# Patient Record
Sex: Female | Born: 1965 | Race: Black or African American | Hispanic: No | Marital: Married | Smoking: Never smoker
Health system: Southern US, Community
[De-identification: ages and names within clinical notes are randomized; demographics above are authoritative.]

## PROBLEM LIST (undated history)

## (undated) DIAGNOSIS — F32A Depression, unspecified: Secondary | ICD-10-CM

## (undated) DIAGNOSIS — R7303 Prediabetes: Secondary | ICD-10-CM

## (undated) DIAGNOSIS — E559 Vitamin D deficiency, unspecified: Secondary | ICD-10-CM

## (undated) DIAGNOSIS — F329 Major depressive disorder, single episode, unspecified: Secondary | ICD-10-CM

## (undated) DIAGNOSIS — F419 Anxiety disorder, unspecified: Secondary | ICD-10-CM

## (undated) DIAGNOSIS — M549 Dorsalgia, unspecified: Secondary | ICD-10-CM

## (undated) HISTORY — DX: Dorsalgia, unspecified: M54.9

## (undated) HISTORY — DX: Prediabetes: R73.03

## (undated) HISTORY — DX: Vitamin D deficiency, unspecified: E55.9

## (undated) HISTORY — DX: Depression, unspecified: F32.A

## (undated) HISTORY — DX: Anxiety disorder, unspecified: F41.9

---
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
## 1999-05-25 ENCOUNTER — Other Ambulatory Visit: Admission: RE | Admit: 1999-05-25 | Discharge: 1999-05-25 | Payer: Self-pay | Admitting: Family Medicine

## 2000-02-16 ENCOUNTER — Ambulatory Visit (HOSPITAL_COMMUNITY): Admission: RE | Admit: 2000-02-16 | Discharge: 2000-02-16 | Payer: Self-pay | Admitting: Family Medicine

## 2000-02-16 ENCOUNTER — Encounter: Payer: Self-pay | Admitting: Family Medicine

## 2002-02-20 ENCOUNTER — Encounter: Payer: Self-pay | Admitting: Obstetrics and Gynecology

## 2002-02-20 ENCOUNTER — Ambulatory Visit (HOSPITAL_COMMUNITY): Admission: RE | Admit: 2002-02-20 | Discharge: 2002-02-20 | Payer: Self-pay | Admitting: Obstetrics and Gynecology

## 2002-03-28 ENCOUNTER — Other Ambulatory Visit: Admission: RE | Admit: 2002-03-28 | Discharge: 2002-03-28 | Payer: Self-pay | Admitting: Obstetrics and Gynecology

## 2002-04-03 ENCOUNTER — Ambulatory Visit (HOSPITAL_COMMUNITY): Admission: RE | Admit: 2002-04-03 | Discharge: 2002-04-03 | Payer: Self-pay | Admitting: Obstetrics and Gynecology

## 2002-04-03 ENCOUNTER — Encounter: Payer: Self-pay | Admitting: Obstetrics and Gynecology

## 2002-07-20 ENCOUNTER — Inpatient Hospital Stay (HOSPITAL_COMMUNITY): Admission: AD | Admit: 2002-07-20 | Discharge: 2002-07-23 | Payer: Self-pay | Admitting: Obstetrics and Gynecology

## 2002-07-24 ENCOUNTER — Encounter: Admission: RE | Admit: 2002-07-24 | Discharge: 2002-08-23 | Payer: Self-pay | Admitting: Obstetrics and Gynecology

## 2002-08-24 ENCOUNTER — Encounter: Admission: RE | Admit: 2002-08-24 | Discharge: 2002-09-23 | Payer: Self-pay | Admitting: Obstetrics and Gynecology

## 2002-10-23 ENCOUNTER — Encounter: Admission: RE | Admit: 2002-10-23 | Discharge: 2002-11-22 | Payer: Self-pay | Admitting: Obstetrics and Gynecology

## 2002-12-23 ENCOUNTER — Encounter: Admission: RE | Admit: 2002-12-23 | Discharge: 2003-01-22 | Payer: Self-pay | Admitting: Obstetrics and Gynecology

## 2003-02-22 ENCOUNTER — Encounter: Admission: RE | Admit: 2003-02-22 | Discharge: 2003-03-24 | Payer: Self-pay | Admitting: Obstetrics and Gynecology

## 2003-03-25 ENCOUNTER — Encounter: Admission: RE | Admit: 2003-03-25 | Discharge: 2003-04-24 | Payer: Self-pay | Admitting: Obstetrics and Gynecology

## 2003-03-31 ENCOUNTER — Other Ambulatory Visit: Admission: RE | Admit: 2003-03-31 | Discharge: 2003-03-31 | Payer: Self-pay | Admitting: Obstetrics and Gynecology

## 2006-06-13 ENCOUNTER — Encounter: Admission: RE | Admit: 2006-06-13 | Discharge: 2006-06-13 | Payer: Self-pay | Admitting: Internal Medicine

## 2007-06-21 ENCOUNTER — Encounter: Admission: RE | Admit: 2007-06-21 | Discharge: 2007-06-21 | Payer: Self-pay | Admitting: Internal Medicine

## 2008-05-09 ENCOUNTER — Encounter: Admission: RE | Admit: 2008-05-09 | Discharge: 2008-05-09 | Payer: Self-pay | Admitting: Internal Medicine

## 2008-06-23 ENCOUNTER — Encounter: Admission: RE | Admit: 2008-06-23 | Discharge: 2008-06-23 | Payer: Self-pay | Admitting: Internal Medicine

## 2010-06-29 ENCOUNTER — Encounter
Admission: RE | Admit: 2010-06-29 | Discharge: 2010-06-29 | Payer: Self-pay | Source: Home / Self Care | Attending: Internal Medicine | Admitting: Internal Medicine

## 2010-07-25 ENCOUNTER — Encounter: Payer: Self-pay | Admitting: Internal Medicine

## 2010-11-19 NOTE — Discharge Summary (Signed)
NAME:  Makayla Matthews, Makayla Matthews                         ACCOUNT NO.:  [id number]   MEDICAL RECORD NO.:  [id number]                   PATIENT TYPE:  INP   LOCATION:  9116                                 FACILITY:  [facility]   PHYSICIAN:  Naima A. Dillard, M.D.              DATE OF BIRTH:  [birth year]   DATE OF ADMISSION:  07/20/2002  DATE OF DISCHARGE:  07/23/2002                                 DISCHARGE SUMMARY   ADMITTING DIAGNOSES:  1. Intrauterine pregnancy at 24 and 3/7 weeks.  2. Breech presentation.   DISCHARGE DIAGNOSES:  1. Intrauterine pregnancy at 76 and 3/7 weeks.  2. Breech presentation.   PROCEDURE:  1. Primary low transverse cesarean section.  2. Spinal anesthesia.   HOSPITAL COURSE:  The patient is a 45 year old gravida 1, para 0 at 45 and  3/7 weeks who was admitted on July 20, 2002 for a scheduled cesarean  section secondary to breech presentation.  The patient's pregnancy had been  essentially uncomplicated.  She was taken to the operating room where a  primary low transverse cesarean section was performed secondary to breech  presentation after the patient declined external version.  The findings were  a viable female by the name of Makayla Matthews.  Apgars were 9 and 9 in a frank breech  presentation.  Weight was 7 pounds 13 ounces.  Apgars were 9 and 9.  Normal  uterus, tubes, and ovaries were noted.  The patient was taken to the  recovery room in good condition.  Infant was taken to the full-term nursery  in good condition.  By postoperative day one patient was doing well.  She  was breast-feeding and was planning Micronor for contraception.  Hemoglobin  was 10.3 down from 11.4.  Physical examination was within normal limits.  The rest of her hospital course was uncomplicated.  On postoperative day  three patient was doing well.  The incision appeared to be well approximated  but there was slight edema noted around the area, nontender.  There was a  small amount of old drainage  noted, but no active drainage.  Decision was  made to maintain the staples until July 26, 2002 at which time they will  be removed in the office.  The patient was deemed to have received the full  benefit of her hospital stay and was discharged home.   DISCHARGE INSTRUCTIONS:  Per [facility] handout.   DISCHARGE MEDICATIONS:  1. Motrin 600 mg p.o. q.6h. p.r.n. pain.  2. Tylox one to two p.o. q.3-4h. p.r.n. pain.   DISCHARGE FOLLOWUP:  Occur on July 26, 2002 at 9:45 a.m. for staple  removal and then at six weeks postoperative for routine postpartum  examination.     Renaldo Reel Emilee Hero, C.N.M.                   Naima A. Normand Sloop, M.D.   VLL/MEDQ  D:  07/23/2002  T:  07/24/2002  Job:  [id number]

## 2010-11-19 NOTE — H&P (Signed)
NAMEDIMITRI, DSOUZA                         ACCOUNT NO.:  [id number]   MEDICAL RECORD NO.:  [id number]                   PATIENT TYPE:  INP   LOCATION:  9116                                 FACILITY:  [facility]   PHYSICIAN:  Osborn Coho, M.D.                DATE OF BIRTH:  19-Jun-1966   DATE OF ADMISSION:  07/20/2002  DATE OF DISCHARGE:                                HISTORY & PHYSICAL   DATE OF BIRTH:  [birth year].   CHIEF COMPLAINT:  Intrauterine pregnancy at term with breech presentation.   HISTORY OF PRESENT ILLNESS:  Ms. Lovins is a 45 year old female, Gravida I,  Para 0 with last menstrual period of October 17, 2001 and estimated date of  delivery of July 24, 2002. Currently at 39 3/7th weeks, consistent with a  first trimester ultrasound. She is being admitted for a primary C-section  secondary to breech presentation. The patient declined external cephalic  version. Her prenatal care has been at [facility] with the [facility] since about 11 weeks of pregnancy. Her  prenatal care has been uncomplicated.   LABORATORY DATA:  Blood type O+. Antibody negative. RPR non-reactive.  Rubella immune. Hepatitis B surface antigen negative. Gonorrhea negative.  Chlamydia negative. PAP was normal. Declined HIV testing. Declined AFP as  well as amniocentesis which was offered secondary to advanced maternal age.  Sickle trait was negative. Hemoglobin of 11.9 and hematocrit of 35.7.  Glucose challenge test was normal with a value of 109.   PAST OBSTETRICAL HISTORY:  Gravida I, Para 0.   PAST GYNECOLOGIC HISTORY:  Twelve X, twenty-eight X, 5-6 days. Denies  history of gonorrhea, Chlamydia, or other STD's or pelvic inflammatory  disease. Positive history of vaginal candidiasis. History of oral  contraceptive use, stopped in March of 2003. Denies history of abnormal PAP  smears.   PAST MEDICAL HISTORY:  Denies.   PAST SURGICAL HISTORY:  Wisdom teeth  extraction.   MEDICATIONS:  Prenatal vitamins.   ALLERGIES:  CODEINE (itching). Also seasonal allergies.   SOCIAL HISTORY:  Denies history of cigarette or alcohol use. No drug use.   FAMILY HISTORY:  Mother with chronic hypertension. Mother, maternal  grandmother, maternal aunt, and first cousin with diabetes mellitus. Mother  with thyroid dysfunction. Father with prostate cancer. First cousin with  cardiovascular accident.   REVIEW OF SYSTEMS:  Normal childhood illnesses. Otherwise, unremarkable.   PHYSICAL EXAMINATION:  VITAL SIGNS: Blood pressure 134/82, pulse 99,  temperature 99, O2 sat 99%. Fetal heart rate 140. Respiratory rate 20.  Weight 123 kg. Height 66 inches.  HEENT: Within normal limits. No thyromegaly.  LUNGS: Clear to auscultation and percussion bilaterally.  CARDIAC: Heart regular rate and rhythm.  ABDOMEN: Gravid. Fundal height approximately 40 cm. Positive FH at 140.  BREAST: From office, no masses, nipple retraction, discoloration or axillary  nodes.  PELVIC: Examination deferred. Bedside ultrasound  today confirms breech  presentation.   ASSESSMENT/PLAN:  Ms. Hayashida is a 45 year old Gravida I, Para 0 at 70 3/7th  weeks, admitted for a primary low transverse C-section secondary to breech  presentation after the patient declined external cephalic eversion. The  risks, benefits, and alternatives of the procedure have been discussed with  the patient and her husband. The patient's husband verbalized understanding  and consent signed and witnessed. Preoperative labs have been reviewed.                                               Osborn Coho, M.D.    AR/MEDQ  D:  07/20/2002  T:  07/20/2002  Job:  [id number]

## 2010-11-19 NOTE — Op Note (Signed)
Makayla Matthews, Makayla Matthews                         ACCOUNT NO.:  [id number]   MEDICAL RECORD NO.:  [id number]                   PATIENT TYPE:  OUT   LOCATION:  ULT                                  FACILITY:  [facility]   PHYSICIAN:  Osborn Coho, M.D.                DATE OF BIRTH:  28-Feb-1966   DATE OF PROCEDURE:  07/20/2002  DATE OF DISCHARGE:                                 OPERATIVE REPORT   PREOPERATIVE DIAGNOSIS:  1. Intrauterine pregnancy at 39-3/7 weeks.  2. Breech presentation.   POSTOPERATIVE DIAGNOSES:  1. Intrauterine pregnancy at 39-3/7 weeks.  2. Breech presentation.   PROCEDURE:  Primary low transverse cesarean section via Pfannenstiel skin  incision.   SURGEON:  Osborn Coho, M.D.   ASSISTANT:  Naima A. Normand Sloop, M.D.   ANESTHESIA:  Spinal.   FLUIDS:  2700 cubic centimeters.   ESTIMATED BLOOD LOSS:  600 cubic centimeters.   URINE OUTPUT:  150 cubic centimeters.   COMPLICATIONS:  None.   FINDINGS:  Live female infant Clifton Custard with Apgars of 9 at one minute and 9 at  five minutes, no nuchal cord.  Frank breech presentation.  Normal bilateral  ovaries and tubes.   DESCRIPTION OF PROCEDURE:  The patient was taken to the operating room after  the risks, benefits and alternatives of the procedure were discussed with  the patient.  The patient verbalized understanding and consent signed and  witnessed.  The patient was given a spinal and placed in the dorsal supine  position with a leftward tilt.  The patient was prepped and draped in the  normal sterile fashion and Foley placed.  A Pfannenstiel skin incision was  made and carried down to the underlying layer of fascia.  The fascia was  excised and extended laterally with the Mayo scissors.  Kocher clamps were  placed on the superior aspect of the fascial incision, and the rectus muscle  excised from the fascia.  The same was done on the inferior aspect of the  fascial incision.  The muscle was separated in the midline,  and the  peritoneum entered bluntly.  Bladder blade was placed for retraction and  bladder flap created with the Metzenbaum scissors.  The bladder blade was  replaced, and the uterine incision made with the knife and extended  bilaterally with the bandage scissors.  The infant was then delivered  without difficulty in the frank breech presentation.  The infant upon  delivery was bulb suctioned in the oropharynx and nasopharynx.  The cord was  clamped and cut, and the patient was given cefoxitin.  The infant was handed  off to the waiting pediatricians.  Cord blood was collected.  The placenta  was delivered via fundal massage.  The uterus was cleared of all clots and  debris.  The uterine incision was repaired with 0 Vicryl in a running locked  fashion.  A second imbricating layer was  performed using 0 Vicryl.  The  abdomen was copiously irrigated.  The ovaries were identified and noted to  be normal bilaterally.  The uterine incision was noted to be hemostatic with  one final stitch placed at the left uterine incision angle.  The peritoneum  was closed with 0 chromic in a running fashion.  The fascia was repaired  with 0 Vicryl in a running fashion.  The subcutaneous tissue was irrigated  and noticed to be hemostatic.  The subcutaneous tissue was reapproximated  with 2-0 plain using interrupteds.  The skin was closed with staples.  Sponge, lap and needle count was correct.  The patient tolerated the  procedure well.  The patient was returned to the recovery room in stable  condition.                                               Osborn Coho, M.D.    AR/MEDQ  D:  07/20/2002  T:  07/21/2002  Job:  [id number]

## 2011-06-16 ENCOUNTER — Other Ambulatory Visit: Payer: Self-pay | Admitting: Internal Medicine

## 2011-06-16 DIAGNOSIS — Z1231 Encounter for screening mammogram for malignant neoplasm of breast: Secondary | ICD-10-CM

## 2011-07-08 ENCOUNTER — Ambulatory Visit
Admission: RE | Admit: 2011-07-08 | Discharge: 2011-07-08 | Disposition: A | Discharge status: Home / Self Care | Payer: BC Managed Care – PPO | Source: Ambulatory Visit | Attending: Internal Medicine | Admitting: Internal Medicine

## 2011-07-08 DIAGNOSIS — Z1231 Encounter for screening mammogram for malignant neoplasm of breast: Secondary | ICD-10-CM

## 2012-06-12 ENCOUNTER — Other Ambulatory Visit: Payer: Self-pay | Admitting: Internal Medicine

## 2012-06-12 DIAGNOSIS — Z1231 Encounter for screening mammogram for malignant neoplasm of breast: Secondary | ICD-10-CM

## 2012-07-16 ENCOUNTER — Ambulatory Visit
Admission: RE | Admit: 2012-07-16 | Discharge: 2012-07-16 | Disposition: A | Discharge status: Home / Self Care | Payer: BC Managed Care – PPO | Source: Ambulatory Visit | Attending: Internal Medicine | Admitting: Internal Medicine

## 2012-07-16 DIAGNOSIS — Z1231 Encounter for screening mammogram for malignant neoplasm of breast: Secondary | ICD-10-CM

## 2013-06-12 ENCOUNTER — Other Ambulatory Visit: Payer: Self-pay

## 2013-06-12 DIAGNOSIS — Z1231 Encounter for screening mammogram for malignant neoplasm of breast: Secondary | ICD-10-CM

## 2013-07-17 ENCOUNTER — Ambulatory Visit
Admission: RE | Admit: 2013-07-17 | Discharge: 2013-07-17 | Disposition: A | Discharge status: Home / Self Care | Payer: BC Managed Care – PPO | Source: Ambulatory Visit

## 2013-07-17 DIAGNOSIS — Z1231 Encounter for screening mammogram for malignant neoplasm of breast: Secondary | ICD-10-CM

## 2014-06-09 ENCOUNTER — Other Ambulatory Visit: Payer: Self-pay

## 2014-06-09 DIAGNOSIS — Z1231 Encounter for screening mammogram for malignant neoplasm of breast: Secondary | ICD-10-CM

## 2014-07-18 ENCOUNTER — Ambulatory Visit: Payer: BC Managed Care – PPO

## 2014-07-28 ENCOUNTER — Other Ambulatory Visit: Payer: Self-pay

## 2014-07-28 ENCOUNTER — Ambulatory Visit
Admission: RE | Admit: 2014-07-28 | Discharge: 2014-07-28 | Disposition: A | Discharge status: Home / Self Care | Payer: BC Managed Care – PPO | Source: Ambulatory Visit

## 2014-07-28 DIAGNOSIS — Z1231 Encounter for screening mammogram for malignant neoplasm of breast: Secondary | ICD-10-CM

## 2014-07-30 ENCOUNTER — Other Ambulatory Visit: Payer: Self-pay | Admitting: Internal Medicine

## 2014-07-30 DIAGNOSIS — R928 Other abnormal and inconclusive findings on diagnostic imaging of breast: Secondary | ICD-10-CM

## 2014-08-01 ENCOUNTER — Other Ambulatory Visit: Payer: Self-pay | Admitting: Nurse Practitioner

## 2014-08-01 DIAGNOSIS — R928 Other abnormal and inconclusive findings on diagnostic imaging of breast: Secondary | ICD-10-CM

## 2014-08-13 ENCOUNTER — Ambulatory Visit
Admission: RE | Admit: 2014-08-13 | Discharge: 2014-08-13 | Disposition: A | Discharge status: Home / Self Care | Payer: BC Managed Care – PPO | Source: Ambulatory Visit | Attending: Nurse Practitioner | Admitting: Nurse Practitioner

## 2014-08-13 DIAGNOSIS — R928 Other abnormal and inconclusive findings on diagnostic imaging of breast: Secondary | ICD-10-CM

## 2015-01-02 ENCOUNTER — Other Ambulatory Visit: Payer: Self-pay

## 2015-01-02 ENCOUNTER — Other Ambulatory Visit: Payer: Self-pay | Admitting: Nurse Practitioner

## 2015-01-02 DIAGNOSIS — R921 Mammographic calcification found on diagnostic imaging of breast: Secondary | ICD-10-CM

## 2015-02-17 ENCOUNTER — Other Ambulatory Visit: Payer: Self-pay | Admitting: Nurse Practitioner

## 2015-02-17 DIAGNOSIS — R921 Mammographic calcification found on diagnostic imaging of breast: Secondary | ICD-10-CM

## 2015-02-26 ENCOUNTER — Ambulatory Visit
Admission: RE | Admit: 2015-02-26 | Discharge: 2015-02-26 | Disposition: A | Discharge status: Home / Self Care | Payer: BC Managed Care – PPO | Source: Ambulatory Visit | Attending: Nurse Practitioner | Admitting: Nurse Practitioner

## 2015-02-26 DIAGNOSIS — R921 Mammographic calcification found on diagnostic imaging of breast: Secondary | ICD-10-CM

## 2015-07-31 ENCOUNTER — Other Ambulatory Visit: Payer: Self-pay | Admitting: Nurse Practitioner

## 2015-07-31 ENCOUNTER — Ambulatory Visit: Payer: BC Managed Care – PPO

## 2015-07-31 DIAGNOSIS — R921 Mammographic calcification found on diagnostic imaging of breast: Secondary | ICD-10-CM

## 2015-08-13 ENCOUNTER — Other Ambulatory Visit: Payer: Self-pay | Admitting: Internal Medicine

## 2015-08-13 DIAGNOSIS — R921 Mammographic calcification found on diagnostic imaging of breast: Secondary | ICD-10-CM

## 2015-08-21 ENCOUNTER — Other Ambulatory Visit: Payer: Self-pay | Admitting: Nurse Practitioner

## 2015-08-21 DIAGNOSIS — R921 Mammographic calcification found on diagnostic imaging of breast: Secondary | ICD-10-CM

## 2015-08-24 ENCOUNTER — Other Ambulatory Visit: Payer: Self-pay | Admitting: Nurse Practitioner

## 2015-08-24 DIAGNOSIS — R921 Mammographic calcification found on diagnostic imaging of breast: Secondary | ICD-10-CM

## 2015-09-01 ENCOUNTER — Ambulatory Visit
Admission: RE | Admit: 2015-09-01 | Discharge: 2015-09-01 | Disposition: A | Discharge status: Home / Self Care | Payer: BC Managed Care – PPO | Source: Ambulatory Visit | Attending: Internal Medicine | Admitting: Internal Medicine

## 2015-09-01 DIAGNOSIS — R921 Mammographic calcification found on diagnostic imaging of breast: Secondary | ICD-10-CM

## 2016-10-26 LAB — HEMOGLOBIN A1C

## 2016-10-26 LAB — BASIC METABOLIC PANEL
BUN: 8 (ref 4–21)
Creatinine: 0.7 (ref 0.5–1.1)
Potassium: 4 (ref 3.4–5.3)
Sodium: 141 (ref 137–147)

## 2017-02-24 LAB — HEPATIC FUNCTION PANEL
ALT: 9 (ref 7–35)
AST: 11 — AB (ref 13–35)
Alkaline Phosphatase: 83 (ref 25–125)
BILIRUBIN, TOTAL: 0.6

## 2017-02-24 LAB — LIPID PANEL
Cholesterol: 152 (ref 0–200)
HDL: 57 (ref 35–70)
LDL Cholesterol: 83
LDl/HDL Ratio: 1.5
Triglycerides: 62 (ref 40–160)

## 2017-02-24 LAB — HEMOGLOBIN A1C: Hemoglobin A1C: 6

## 2017-02-24 LAB — TSH: TSH: 1.42 (ref 0.41–5.90)

## 2017-02-24 LAB — BASIC METABOLIC PANEL
BUN: 12 (ref 4–21)
CREATININE: 0.8 (ref 0.5–1.1)
Glucose: 87
Potassium: 4.2 (ref 3.4–5.3)
SODIUM: 139 (ref 137–147)

## 2017-02-24 LAB — VITAMIN D 25 HYDROXY (VIT D DEFICIENCY, FRACTURES): Vit D, 25-Hydroxy: 41.7

## 2018-02-05 ENCOUNTER — Ambulatory Visit: Payer: BC Managed Care – PPO | Admitting: Family Medicine

## 2018-02-05 ENCOUNTER — Encounter: Payer: Self-pay | Admitting: Family Medicine

## 2018-02-05 VITALS — BP 139/93 | HR 92 | Ht 67.0 in | Wt 275.5 lb

## 2018-02-05 DIAGNOSIS — R030 Elevated blood-pressure reading, without diagnosis of hypertension: Secondary | ICD-10-CM | POA: Insufficient documentation

## 2018-02-05 DIAGNOSIS — E559 Vitamin D deficiency, unspecified: Secondary | ICD-10-CM | POA: Insufficient documentation

## 2018-02-05 DIAGNOSIS — I872 Venous insufficiency (chronic) (peripheral): Secondary | ICD-10-CM | POA: Diagnosis not present

## 2018-02-05 DIAGNOSIS — Z111 Encounter for screening for respiratory tuberculosis: Secondary | ICD-10-CM

## 2018-02-05 DIAGNOSIS — R7303 Prediabetes: Secondary | ICD-10-CM | POA: Insufficient documentation

## 2018-02-05 DIAGNOSIS — J3089 Other allergic rhinitis: Secondary | ICD-10-CM

## 2018-02-05 DIAGNOSIS — Z8349 Family history of other endocrine, nutritional and metabolic diseases: Secondary | ICD-10-CM | POA: Insufficient documentation

## 2018-02-05 NOTE — Patient Instructions (Addendum)
Dr. Gertie Exon or Dr. Marda Stalker Miller's who I recommend for her GYN care and who you can see regarding menopause concerns    Please realize, EXERCISE IS MEDICINE!  -  American Heart Association [facility]) guidelines for exercise : If you are in good health, without any medical conditions, you should engage in 150 minutes of moderate intensity aerobic activity per week.  This means you should be huffing and puffing throughout your workout.   Engaging in regular exercise will improve brain function and memory, as well as improve mood, boost immune system and help with weight management.  As well as the other, more well-known effects of exercise such as decreasing blood sugar levels, decreasing blood pressure,  and decreasing bad cholesterol levels/ increasing good cholesterol levels.     -  The AHA strongly endorses consumption of a diet that contains a variety of foods from all the food categories with an emphasis on fruits and vegetables; fat-free and low-fat dairy products; cereal and grain products; legumes and nuts; and fish, poultry, and/or extra lean meats.    Excessive food intake, especially of foods high in saturated and trans fats, sugar, and salt, should be avoided.    Adequate water intake of roughly 1/2 of your weight in pounds, should equal the ounces of water per day you should drink.  So for instance, if you're 200 pounds, that would be 100 ounces of water per day.         Mediterranean Diet  Why follow it? Research shows  Those who follow the Mediterranean diet have a reduced risk of heart disease   The diet is associated with a reduced incidence of Parkinson's and Alzheimer's diseases  People following the diet may have longer life expectancies and lower rates of chronic diseases   The Dietary Guidelines for Americans recommends the Mediterranean diet as an eating plan to promote health and prevent disease  What Is the Mediterranean Diet?   Healthy eating plan based on  typical foods and recipes of Mediterranean-style cooking  The diet is primarily a plant based diet; these foods should make up a majority of meals   Starches - Plant based foods should make up a majority of meals - They are an important sources of vitamins, minerals, energy, antioxidants, and fiber - Choose whole grains, foods high in fiber and minimally processed items  - Typical grain sources include wheat, oats, barley, corn, brown rice, bulgar, farro, millet, polenta, couscous  - Various types of beans include chickpeas, lentils, fava beans, Cassar beans, white beans   Fruits  Veggies - Large quantities of antioxidant rich fruits & veggies; 6 or more servings  - Vegetables can be eaten raw or lightly drizzled with oil and cooked  - Vegetables common to the traditional Mediterranean Diet include: artichokes, arugula, beets, broccoli, brussel sprouts, cabbage, carrots, celery, collard greens, cucumbers, eggplant, kale, leeks, lemons, lettuce, mushrooms, okra, onions, peas, peppers, potatoes, pumpkin, radishes, rutabaga, shallots, spinach, sweet potatoes, turnips, zucchini - Fruits common to the Mediterranean Diet include: apples, apricots, avocados, cherries, clementines, dates, figs, grapefruits, grapes, melons, nectarines, oranges, peaches, pears, pomegranates, strawberries, tangerines  Fats - Replace butter and margarine with healthy oils, such as olive oil, canola oil, and tahini  - Limit nuts to no more than a handful a day  - Nuts include walnuts, almonds, pecans, pistachios, pine nuts  - Limit or avoid candied, honey roasted or heavily salted nuts - Olives are central to the Mediterranean diet - can  be eaten whole or used in a variety of dishes   Meats Protein - Limiting red meat: no more than a few times a month - When eating red meat: choose lean cuts and keep the portion to the size of deck of cards - Eggs: approx. 0 to 4 times a week  - Fish and lean poultry: at least 2 a week  -  Healthy protein sources include, chicken, [place], lean beef, lamb - Increase intake of seafood such as tuna, salmon, trout, mackerel, shrimp, scallops - Avoid or limit high fat processed meats such as sausage and bacon  Dairy - Include moderate amounts of low fat dairy products  - Focus on healthy dairy such as fat free yogurt, skim milk, low or reduced fat cheese - Limit dairy products higher in fat such as whole or 2% milk, cheese, ice cream  Alcohol - Moderate amounts of red wine is ok  - No more than 5 oz daily for women (all ages) and men older than age 66  - No more than 10 oz of wine daily for men younger than 34  Other - Limit sweets and other desserts  - Use herbs and spices instead of salt to flavor foods  - Herbs and spices common to the traditional Mediterranean Diet include: basil, bay leaves, chives, cloves, cumin, fennel, garlic, lavender, marjoram, mint, oregano, parsley, pepper, rosemary, sage, savory, sumac, tarragon, thyme   Its not just a diet, its a lifestyle:   The Mediterranean diet includes lifestyle factors typical of those in the region   Foods, drinks and meals are best eaten with others and savored  Daily physical activity is important for overall good health  This could be strenuous exercise like running and aerobics  This could also be more leisurely activities such as walking, housework, yard-work, or taking the stairs  Moderation is the key; a balanced and healthy diet accommodates most foods and drinks  Consider portion sizes and frequency of consumption of certain foods   Meal Ideas & Options:   Breakfast:  o Whole wheat toast or whole wheat English muffins with peanut butter & hard boiled egg o Steel cut oats topped with apples & cinnamon and skim milk  o Fresh fruit: banana, strawberries, melon, berries, peaches  o Smoothies: strawberries, bananas, greek yogurt, peanut butter o Low fat greek yogurt with blueberries and granola  o Egg white  omelet with spinach and mushrooms o Breakfast couscous: whole wheat couscous, apricots, skim milk, cranberries   Sandwiches:  o Hummus and grilled vegetables (peppers, zucchini, squash) on whole wheat bread   o Grilled chicken on whole wheat pita with lettuce, tomatoes, cucumbers or tzatziki  o Tuna salad on whole wheat bread: tuna salad made with greek yogurt, olives, red peppers, capers, green onions o Garlic rosemary lamb pita: lamb sauted with garlic, rosemary, salt & pepper; add lettuce, cucumber, greek yogurt to pita - flavor with lemon juice and [place] pepper   Seafood:  o Mediterranean grilled salmon, seasoned with garlic, basil, parsley, lemon juice and [place] pepper o Shrimp, lemon, and spinach whole-grain pasta salad made with low fat greek yogurt  o Seared scallops with lemon orzo  o Seared tuna steaks seasoned salt, pepper, coriander topped with tomato mixture of olives, tomatoes, olive oil, minced garlic, parsley, green onions and cappers   Meats:  o Herbed greek chicken salad with kalamata olives, cucumber, feta  o Red bell peppers stuffed with spinach, bulgur, lean ground beef (or lentils) &  topped with feta   o Kebabs: skewers of chicken, tomatoes, onions, zucchini, squash  o [place] burgers: made with red onions, mint, dill, lemon juice, feta cheese topped with roasted red peppers  Vegetarian o Cucumber salad: cucumbers, artichoke hearts, celery, red onion, feta cheese, tossed in olive oil & lemon juice  o Hummus and whole grain pita points with a greek salad (lettuce, tomato, feta, olives, cucumbers, red onion) o Lentil soup with celery, carrots made with vegetable broth, garlic, salt and pepper  o Tabouli salad: parsley, bulgur, mint, scallions, cucumbers, tomato, radishes, lemon juice, olive oil, salt and pepper.     Behavior Modification Ideas for Weight Management  Weight management involves adopting a healthy lifestyle that includes a knowledge of nutrition and  exercise, a positive attitude and the right kind of motivation. Internal motives such as better health, increased energy, self-esteem and personal control increase your chances of lifelong weight management success.  Remember to have realistic goals and think long-term success. Believe in yourself and you can do it. The following information will give you ideas to help you meet your goals.  Control Your Home Environment  Eat only while sitting down at the kitchen or dining room table. Do not eat while watching television, reading, cooking, talking on the phone, standing at the refrigerator or working on the computer. Keep tempting foods out of the house -- don't buy them. Keep tempting foods out of sight. Have low-calorie foods ready to eat. Unless you are preparing a meal, stay out of the kitchen. Have healthy snacks at your disposal, such as small pieces of fruit, vegetables, canned fruit, pretzels, low-fat string cheese and nonfat cottage cheese.  Control Your Work Environment  Do not eat at [employer] or keep tempting snacks at your desk. If you get hungry between meals, plan healthy snacks and bring them with you to work. During your breaks, go for a walk instead of eating. If you work around food, plan in advance the one item you will eat at mealtime. Make it inconvenient to nibble on food by chewing gum, sugarless candy or drinking water or another low-calorie beverage. Do not work through meals. Skipping meals slows down metabolism and may result in overeating at the next meal. If food is available for special occasions, either pick the healthiest item, nibble on low-fat snacks brought from home, don't have anything offered, choose one option and have a small amount, or have only a beverage.  Control Your Mealtime Environment  Serve your plate of food at the stove or kitchen counter. Do not put the serving dishes on the table. If you do put dishes on the table, remove them immediately  when finished eating. Fill half of your plate with vegetables, a quarter with lean protein and a quarter with starch. Use smaller plates, bowls and glasses. A smaller portion will look large when it is in a little dish. Politely refuse second helpings. When fixing your plate, limit portions of food to one scoop/serving or less.   Daily Food Management  Replace eating with another activity that you will not associate with food. Wait 20 minutes before eating something you are craving. Drink a large glass of water or diet soda before eating. Always have a big glass or bottle of water to drink throughout the day. Avoid high-calorie add-ons such as cream with your coffee, butter, mayonnaise and salad dressings.  Shopping: Do not shop when hungry or tired. Shop from a list and avoid buying anything that  is not on your list. If you must have tempting foods, buy individual-sized packages and try to find a lower-calorie alternative. Don't taste test in the store. Read food labels. Compare products to help you make the healthiest choices.  Preparation: Chew a piece of gum while cooking meals. Use a quarter teaspoon if you taste test your food. Try to only fix what you are going to eat, leaving yourself no chance for seconds. If you have prepared more food than you need, portion it into individual containers and freeze or refrigerate immediately. Don't snack while cooking meals.  Eating: Eat slowly. Remember it takes about 20 minutes for your stomach to send a message to your brain that it is full. Don't let fake hunger make you think you need more. The ideal way to eat is to take a bite, put your utensil down, take a sip of water, cut your next bite, take a bit, put your utensil down and so on. Do not cut your food all at one time. Cut only as needed. Take small bites and chew your food well. Stop eating for a minute or two at least once during a meal or snack. Take breaks to reflect and have  conversation.  Cleanup and Leftovers: Label leftovers for a specific meal or snack. Freeze or refrigerate individual portions of leftovers. Do not clean up if you are still hungry.  Eating Out and Social Eating  Do not arrive hungry. Eat something light before the meal. Try to fill up on low-calorie foods, such as vegetables and fruit, and eat smaller portions of the high-calorie foods. Eat foods that you like, but choose small portions. If you want seconds, wait at least 20 minutes after you have eaten to see if you are actually hungry or if your eyes are bigger than your stomach. Limit alcoholic beverages. Try a soda water with a twist of lime. Do not skip other meals in the day to save room for the special event.  At Restaurants: Order  la carte rather than buffet style. Order some vegetables or a salad for an appetizer instead of eating bread. If you order a high-calorie dish, share it with someone. Try an after-dinner mint with your coffee. If you do have dessert, share it with two or more people. Don't overeat because you do not want to waste food. Ask for a doggie bag to take extra food home. Tell the server to put half of your entree in a to go bag before the meal is served to you. Ask for salad dressing, gravy or high-fat sauces on the side. Dip the tip of your fork in the dressing before each bite. If bread is served, ask for only one piece. Try it plain without butter or oil. At [employer] restaurants where oil and vinegar is served with bread, use only a small amount of oil and a lot of vinegar for dipping.  At a Friend's House: Offer to bring a dish, appetizer or dessert that is low in calories. Serve yourself small portions or tell the host that you only want a small amount. Stand or sit away from the snack table. Stay away from the kitchen or stay busy if you are near the food. Limit your alcohol intake.  At [employer]: Cover most of your plate with lettuce  and/or vegetables. Use a salad plate instead of a dinner plate. After eating, clear away your dishes before having coffee or tea.  Entertaining at Home: Explore low-fat, low-cholesterol cookbooks.  Use single-serving foods like chicken breasts or hamburger patties. Prepare low-calorie appetizers and desserts.   Holidays: Keep tempting foods out of sight. Decorate the house without using food. Have low-calorie beverages and foods on hand for guests. Allow yourself one planned treat a day. Don't skip meals to save up for the holiday feast. Eat regular, planned meals.   Exercise Well  Make exercise a priority and a planned activity in the day. If possible, walk the entire or part of the distance to work. Get an exercise buddy. Go for a walk with a colleague during one of your breaks, go to the gym, run or take a walk with a friend, walk in the mall with a shopping companion. Park at the end of the parking lot and walk to the store or office entrance. Always take the stairs all of the way or at least part of the way to your floor. If you have a desk job, walk around the office frequently. Do leg lifts while sitting at your desk. Do something outside on the weekends like going for a hike or a bike ride.   Have a Healthy Attitude  Make health your weight management priority. Be realistic. Have a goal to achieve a healthier you, not necessarily the lowest weight or ideal weight based on calculations or tables. Focus on a healthy eating style, not on dieting. Dieting usually lasts for a short amount of time and rarely produces long-term success. Think long term. You are developing new healthy behaviors to follow next month, in a year and in a decade.    This information is for educational purposes only and is not intended to replace the advice of your doctor or health care provider. We encourage you to discuss with your doctor any questions or concerns you may  have.        Guidelines for Losing Weight   We want weight loss that will last so you should lose 1-2 pounds a week.  THAT IS IT! Please pick THREE things a month to change. Once it is a habit check off the item. Then pick another three items off the list to become habits.  If you are already doing a habit on the list GREAT!  Cross that item off!  Dont drink your calories. Ie, alcohol, soda, fruit juice, and sweet tea.   Drink more water. Drink a glass when you feel hungry or before each meal.   Eat breakfast - Complex carb and protein (likeDannon light and fit yogurt, oatmeal, fruit, eggs, [place] bacon).  Measure your cereal.  Eat no more than one cup a day. (ie Kashi)  Eat an apple a day.  Add a vegetable a day.  Try a new vegetable a month.  Use Pam! Stop using oil or butter to cook.  Dont finish your plate or use smaller plates.  Share your dessert.  Eat sugar free Jello for dessert or frozen grapes.  Dont eat 2-3 hours before bed.  Switch to whole wheat bread, pasta, and brown rice.  Make healthier choices when you eat out. No fries!  Pick baked chicken, NOT fried.  Dont forget to SLOW DOWN when you eat. It is not going anywhere.   Take the stairs.  Park far away in the parking lot  Lift soup cans (or weights) for 10 minutes while watching TV.  Walk at work for 10 minutes during break.  Walk outside 1 time a week with your friend, kids, dog, or significant other.  Start a walking group at church.  Walk the mall as much as you can tolerate.   Keep a food diary.  Weigh yourself daily.  Walk for 15 minutes 3 days per week.  Cook at home more often and eat out less. If life happens and you go back to old habits, it is okay.  Just start over. You can do it!  If you experience chest pain, get short of breath, or tired during the exercise, please stop immediately and inform your doctor.    Before you even begin to attack a weight-loss plan, it  pays to remember this: You are not fat. You have fat. Losing weight isn't about blame or shame; it's simply another achievement to accomplish. Dieting is like any other skill--you have to buckle down and work at it. As long as you act in a smart, reasonable way, you'll ultimately get where you want to be. Here are some weight loss pearls for you.   1. It's Not a Diet. It's a Lifestyle Thinking of a diet as something you're on and suffering through only for the short term doesn't work. To shed weight and keep it off, you need to make permanent changes to the way you eat. It's OK to indulge occasionally, of course, but if you cut calories temporarily and then revert to your old way of eating, you'll gain back the weight quicker than you can say yo-yo. Use it to lose it. Research shows that one of the best predictors of long-term weight loss is how many pounds you drop in the first month. For that reason, nutritionists often suggest being stricter for the first two weeks of your new eating strategy to build momentum. Cut out added sugar and alcohol and avoid unrefined carbs. After that, figure out how you can reincorporate them in a way that's healthy and maintainable.  2. There's a Right Way to Exercise Working out burns calories and fat and boosts your metabolism by building muscle. But those trying to lose weight are notorious for overestimating the number of calories they burn and underestimating the amount they take in. Unfortunately, your system is biologically programmed to hold on to extra pounds and that means when you start exercising, your body senses the deficit and ramps up its hunger signals. If you're not diligent, you'll eat everything you burn and then some. Use it, to lose it. Cardio gets all the exercise glory, but strength and interval training are the real heroes. They help you build lean muscle, which in turn increases your metabolism and calorie-burning ability 3. Don't Overreact to Mild  Hunger Some people have a hard time losing weight because of hunger anxiety. To them, being hungry is bad--something to be avoided at all costs--so they carry snacks with them and eat when they don't need to. Others eat because they're stressed out or bored. While you never want to get to the point of being ravenous (that's when bingeing is likely to happen), a hunger pang, a craving, or the fact that it's 3:00 p.m. should not send you racing for the vending machine or obsessing about the energy bar in your purse. Ideally, you should put off eating until your stomach is growling and it's difficult to concentrate.  Use it to lose it. When you feel the urge to eat, use the HALT method. Ask yourself, Am I really hungry? Or am I angry or anxious, lonely or bored, or tired? If you're still not certain, try the apple test. If  you're truly hungry, an apple should seem delicious; if it doesn't, something else is going on. Or you can try drinking water and making yourself busy, if you are still hungry try a healthy snack.  4. Not All Calories Are Created Equal The mechanics of weight loss are pretty simple: Take in fewer calories than you use for energy. But the kind of food you eat makes all the difference. Processed food that's high in saturated fat and refined starch or sugar can cause inflammation that disrupts the hormone signals that tell your brain you're full. The result: You eat a lot more.  Use it to lose it. Clean up your diet. Swap in whole, unprocessed foods, including vegetables, lean protein, and healthy fats that will fill you up and give you the biggest nutritional bang for your calorie buck. In a few weeks, as your brain starts receiving regular hunger and fullness signals once again, you'll notice that you feel less hungry overall and naturally start cutting back on the amount you eat.  5. Protein, Produce, and Plant-Based Fats Are Your Weight-Loss Trinity Here's why eating the three Ps regularly  will help you drop pounds. Protein fills you up. You need it to build lean muscle, which keeps your metabolism humming so that you can torch more fat. People in a weight-loss program who ate double the recommended daily allowance for protein (about 110 grams for a 150-pound woman) lost 70 percent of their weight from fat, while people who ate the RDA lost only about 40 percent, one study found. Produce is packed with filling fiber. "It's very difficult to consume too many calories if you're eating a lot of vegetables. Example: Three cups of broccoli is a lot of food, yet only 93 calories. (Fruit is another story. It can be easy to overeat and can contain a lot of calories from sugar, so be sure to monitor your intake.) Plant-based fats like olive oil and those in avocados and nuts are healthy and extra satiating.  Use it to lose it. Aim to incorporate each of the three Ps into every meal and snack. People who eat protein throughout the day are able to keep weight off, according to a study in the American Journal of Clinical Nutrition. In addition to meat, poultry and seafood, good sources are beans, lentils, eggs, tofu, and yogurt. As for fat, keep portion sizes in check by measuring out salad dressing, oil, and nut butters (shoot for one to two tablespoons). Finally, eat veggies or a little fruit at every meal. People who did that consumed 308 fewer calories but didn't feel any hungrier than when they didn't eat more produce.  7. How You Eat Is As Important As What You Eat In order for your brain to register that you're full, you need to focus on what you're eating. Sit down whenever you eat, preferably at a table. Turn off the TV or computer, put down your phone, and look at your food. Smell it. Chew slowly, and don't put another bite on your fork until you swallow. When women ate lunch this attentively, they consumed 30 percent less when snacking later than those who listened to an audiobook at lunchtime,  according to a study in the [place] Journal of Nutrition. 8. Weighing Yourself Really Works The scale provides the best evidence about whether your efforts are paying off. Seeing the numbers tick up or down or stagnate is motivation to keep going--or to rethink your approach. A 2015 study at [facility]  found that daily weigh-ins helped people lose more weight, keep it off, and maintain that loss, even after two years. Use it to lose it. Step on the scale at the same time every day for the best results. If your weight shoots up several pounds from one weigh-in to the next, don't freak out. Eating a lot of salt the night before or having your period is the likely culprit. The number should return to normal in a day or two. It's a steady climb that you need to do something about. 9. Too Much Stress and Too Little Sleep Are Your Enemies When you're tired and frazzled, your body cranks up the production of cortisol, the stress hormone that can cause carb cravings. Not getting enough sleep also boosts your levels of ghrelin, a hormone associated with hunger, while suppressing leptin, a hormone that signals fullness and satiety. People on a diet who slept only five and a half hours a night for two weeks lost 55 percent less fat and were hungrier than those who slept eight and a half hours, according to a study in the [place] [employer] Journal. Use it to lose it. Prioritize sleep, aiming for seven hours or more a night, which research shows helps lower stress. And make sure you're getting quality zzz's. If a snoring spouse or a fidgety cat wakes you up frequently throughout the night, you may end up getting the equivalent of just four hours of sleep, according to a study from [facility]. Keep pets out of the bedroom, and use a white-noise app to drown out snoring. 10. You Will Hit a plateau--And You Can Bust Through It As you slim down, your body releases much less leptin, the fullness  hormone.  If you're not strength training, start right now. Building muscle can raise your metabolism to help you overcome a plateau. To keep your body challenged and burning calories, incorporate new moves and more intense intervals into your workouts or add another sweat session to your weekly routine. Alternatively, cut an extra 100 calories or so a day from your diet. Now that you've lost weight, your body simply doesn't need as much fuel.    Since food equals calories, in order to lose weight you must either eat fewer calories, exercise more to burn off calories with activity, or both. Food that is not used to fuel the body is stored as fat. A major component of losing weight is to make smarter food choices. Here's how:  1)   Limit non-nutritious foods, such as: Sugar, honey, syrups and candy Pastries, donuts, pies, cakes and cookies Soft drinks, sweetened juices and alcoholic beverages  2)  Cut down on high-fat foods by: - Choosing poultry, fish or lean red meat - Choosing low-fat cooking methods, such as baking, broiling, steaming, grilling and boiling - Using low-fat or non-fat dairy products - Using vinaigrette, herbs, lemon or fat-free salad dressings - Avoiding fatty meats, such as bacon, sausage, franks, ribs and luncheon meats - Avoiding high-fat snacks like nuts, chips and chocolate - Avoiding fried foods - Using less butter, margarine, oil and mayonnaise - Avoiding high-fat gravies, cream sauces and cream-based soups  3) Eat a variety of foods, including: - Fruit and vegetables that are raw, steamed or baked - Whole grains, breads, cereal, rice and pasta - Dairy products, such as low-fat or non-fat milk or yogurt, low-fat cottage cheese and low-fat cheese - Protein-rich foods like chicken, [place], fish, lean meat and legumes, or beans  4) Change your eating habits by: - Eat three balanced meals a day to help control your hunger - Watch portion sizes and eat small  servings of a variety of foods - Choose low-calorie snacks - Eat only when you are hungry and stop when you are satisfied - Eat slowly and try not to perform other tasks while eating - Find other activities to distract you from food, such as walking, taking up a hobby or being involved in the community - Include regular exercise in your daily routine ( minimum of 20 min of moderate-intensity exercise at least 5 days/week)  - Find a support group, if necessary, for emotional support in your weight loss journey           Easy ways to cut 100 calories   1. Eat your eggs with hot sauce OR salsa instead of cheese.  Eggs are great for breakfast, but many people consider eggs and cheese to be BFFs. Instead of cheese--1 oz. of cheddar has 114 calories--top your eggs with hot sauce, which contains no calories and helps with satiety and metabolism. Salsa is also a great option!!  2. Top your toast, waffles or pancakes with fresh berries instead of jelly or syrup. Half a cup of berries--fresh, frozen or thawed--has about 40 calories, compared with 2 tbsp. of maple syrup or jelly, which both have about 100 calories. The berries will also give you a good punch of fiber, which helps keep you full and satisfied and wont spike blood sugar quickly like the jelly or syrup. 3. Swap the non-fat latte for [place] coffee with a splash of half-and-half. Contrary to its name, that non-fat latte has 130 calories and a startling 19g of carbohydrates per 16 oz. serving. Replacing that light drinkable dessert with a [place] coffee with a splash of half-and-half saves you more than 100 calories per 16 oz. serving. 4. Sprinkle salads with freeze-dried raspberries instead of dried cranberries. If you want a sweet addition to your nutritious salad, stay away from dried cranberries. They have a whopping 130 calories per  cup and 30g carbohydrates. Instead, sprinkle freeze-dried raspberries guilt-free and save more than 100  calories per  cup serving, adding 3g of belly-filling fiber. 5. Go for mustard in place of mayo on your sandwich. Mustard can add really nice flavor to any sandwich, and there are tons of varieties, from spicy to honey. A serving of mayo is 95 calories, versus 10 calories in a serving of mustard.  Or try an avocado mayo spread: You can find the recipe few click this link: https://www.californiaavocado.com/recipes/recipe-container/california-avocado-mayo 6. Choose a DIY salad dressing instead of the store-bought kind. Mix Dijon or whole grain mustard with low-fat Kefir or red wine vinegar and garlic. 7. Use hummus as a spread instead of a dip. Use hummus as a spread on a high-fiber cracker or tortilla with a sandwich and save on calories without sacrificing taste. 8. Pick just one salad accessory. Salad isnt automatically a calorie winner. Its easy to over-accessorize with toppings. Instead of topping your salad with nuts, avocado and cranberries (all three will clock in at 313 calories), just pick one. The next day, choose a different accessory, which will also keep your salad interesting. You dont wear all your jewelry every day, right? 9. Ditch the white pasta in favor of spaghetti squash. One cup of cooked spaghetti squash has about 40 calories, compared with traditional spaghetti, which comes with more than 200. Spaghetti squash is also nutrient-dense. Its a good  source of fiber and Vitamins A and C, and it can be eaten just like you would eat pasta--with a great tomato sauce and [place] meatballs or with pesto, tofu and spinach, for example. 10. Dress up your chili, soups and stews with non-fat [place] yogurt instead of sour cream. Just a dollop of sour cream can set you back 115 calories and a whopping 12g of fat--seven of which are of the artery-clogging variety. Added bonus: [place] yogurt is packed with muscle-building protein, calcium and B Vitamins. 11. Mash cauliflower instead of  mashed potatoes. One cup of traditional mashed potatoes--in all their creamy goodness--has more than 200 calories, compared to mashed cauliflower, which you can typically eat for less than 100 calories per 1 cup serving. Cauliflower is a great source of the antioxidant indole-3-carbinol (I3C), which may help reduce the risk of some cancers, like breast cancer. 12. Ditch the ice cream sundae in favor of a [place] yogurt parfait. Instead of a cup of ice cream or fro-yo for dessert, try 1 cup of nonfat Greek yogurt topped with fresh berries and a sprinkle of cacao nibs. Both toppings are packed with antioxidants, which can help reduce cellular inflammation and oxidative damage. And the comparison is a no-brainer: One cup of ice cream has about 275 calories; one cup of frozen yogurt has about 230; and a cup of Greek yogurt has just 130, plus twice the protein, so youre less likely to return to the freezer for a second helping. 13. Put olive oil in a spray container instead of using it directly from the bottle. Each tablespoon of olive oil is 120 calories and 15g of fat. Use a mister instead of pouring it straight into the pan or onto a salad. This allows for portion control and will save you more than 100 calories. 14. When baking, substitute canned pumpkin for butter or oil. Canned pumpkin--not pumpkin pie mix--is loaded with Vitamin A, which is important for skin and eye health, as well as immunity. And the comparisons are pretty crazy:  cup of canned pumpkin has about 40 calories, compared to butter or oil, which has more than 800 calories. Yes, 800 calories. Applesauce and mashed banana can also serve as good substitutions for butter or oil, usually in a 1:1 ratio. 15. Top casseroles with high-fiber cereal instead of breadcrumbs. Breadcrumbs are typically made with white bread, while breakfast cereals contain 5-9g of fiber per serving. Not only will you save more than 150 calories per  cup serving, the  swap will also keep you more full and youll get a metabolism boost from the added fiber. 16. Snack on pistachios instead of macadamia nuts. Believe it or not, you get the same amount of calories from 35 pistachios (100 calories) as you would from only five macadamia nuts. 17. Chow down on kale chips rather than potato chips. This is my favorite dont knock it till you try it swap. Kale chips are so easy to make at home, and you can spice them up with a little grated parmesan or chili powder. Plus, theyre a mere fraction of the calories of potato chips, but with the same crunch factor we crave so often. 18. Add seltzer and some fruit slices to your cocktail instead of soda or fruit juice. One cup of soda or fruit juice can pack on as much as 140 calories. Instead, use seltzer and fruit slices. The fruit provides valuable phytochemicals, such as flavonoids and anthocyanins, which help to combat cancer and stave off  the aging process.

## 2018-02-05 NOTE — Progress Notes (Signed)
New patient office visit note:  Impression and Recommendations:    1. Elevated blood pressure reading   2. Venous insufficiency (chronic) (peripheral)- R >etr L LExt   3. Prediabetes   4. Family history of thyroid disease   5. Environmental and seasonal allergies   6. Vitamin D deficiency   7. Encounter for PPD test     Gynocology -Explained importance of gynecological doctor for addressing menopausal symptoms -Recommended a referral for GYN for her gynecological needs -Discussed importance of regular mammograms and Pap smears due to age and continuing menstrual cycles  HTN -Discussed with patient current BP goals and notified she needs to lower her blood pressure -Requested ambulatory BP log with next appointment in 4-6 weeks -Stressed the importance of stress, diet and exercise in managing HTN   GI -Referred patient for colonoscopy due to age  Prediabetes -Requested patient get A1C tested and return within 4-6 weeks to discuss results -Stressed the importance of diet and exercise in managing prediabetes and preventing DM  Cholesterol -Requested patient get lipid panel and return within 4-6 weeks to discuss results -Stressed the importance of stress, diet and exercise in managing and preventing HLD  Lifestyle -Discussed importance of watching food portions as well as what we are eating  BMI -Explained to patient what BMI refers to, and what it means medically.    Told patient to think about it as a "medical risk stratification measurement" and how increasing BMI is associated with increasing risk/ or worsening state of various diseases such as hypertension, hyperlipidemia, diabetes, premature OA, depression etc.  -American Heart Association guidelines for healthy diet, basically Mediterranean diet, and exercise guidelines of 30 minutes 5 days per week or more discussed in detail.  -Health counseling performed.  All questions answered.  EXCERCISE -Please  realize, EXERCISE IS MEDICINE! -  American Heart Association [facility]( AHA) guidelines for exercise : you should engage in 150 minutes of moderate intensity aerobic activity per week.  - Engaging in regular exercise will improve brain function and memory, as well as improve mood, boost immune system and help with weight management.   - Other beneficial effects of exercise: decreasing blood sugar levels, decreasing blood pressure,  and decreasing bad cholesterol levels/ increasing good cholesterol levels.   -  The AHA strongly endorses consumption of a diet that contains a variety of foods from all the food categories with an emphasis on fruits and vegetables; fat-free and low-fat dairy products; cereal and grain products; legumes and nuts; and fish, poultry, and/or extra lean meats.     - Excessive food intake, especially of foods high in saturated and trans fats, sugar, and salt, should be avoided.     - Adequate water intake of roughly 1/2 of your weight in pounds, should equal the ounces of water per day you should drink.  So for instance, if you're 200 pounds, that would be 100 ounces of water per day     Education and routine counseling performed. Handouts provided.  Orders Placed This Encounter  Procedures  . TB Skin Test    No orders of the defined types were placed in this encounter.   Gross side effects, risk and benefits, and alternatives of medications discussed with patient.  Patient is aware that all medications have potential side effects and we are unable to predict every side effect or drug-drug interaction that may occur.  Expresses verbal understanding and consents to current therapy plan and treatment regimen.  Return  for Chronic OV w me near future & FBW 2-3d prior.  Please see AVS handed out to patient at the end of our visit for further patient instructions/ counseling done pertaining to today's office visit.    Note: This document was prepared using Dragon voice recognition  software and may include unintentional dictation errors.     This document serves as a record of services personally performed by Thomasene Lot, MD. It was created on her behalf by Alphonse Guild, a trained medical scribe. The creation of this record is based on the scribe's personal observations and the provider's statements to them.   I have reviewed the above medical documentation for accuracy and completeness and I concur.  Thomasene Lot 03/25/18 4:52 PM    ----------------------------------------------------------------------------------------------------------------------    Subjective:    Chief complaint:   Chief Complaint  Patient presents with  . Establish Care     HPI: Makayla Matthews is a pleasant 52 y.o. female who presents to [facility] today to review their medical history with me and establish care.   I asked the patient to review their chronic problem list with me to ensure everything was updated and accurate.    All recent office visits with other providers, any medical records that patient brought in etc  - I reviewed today.     We asked pt to get [place] their medical records from [facility] providers/ specialists that they had seen within the past 3-5 years- if they are in private practice and/or do not work for [employer], [facility], [facility], [facility] or [employer] owned practice.  Told them to call their specialists to clarify this if they are not sure.   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 -Walks 2-3 miles for 30-45 min a week. Started walking in April or May -States she just wanted to start with exercise instead of weight. 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 DM Dx in her 34's and her  current A1C is 5.9 -States her son gets his height from her family -She is the primary caretaker of her mother who has mid-stage alzheimers. Mother was diagnosed in her 67s -Mother had thyroid abnormality; is currently on thyroid medication  -Previous PCP was Dr. Serita Sheller; left because she felt like a number and did not have a good relationship with the doctor -Patient states she was 315 at her heaviest in college and lost weight to 185 -Decided to start exercising more but has gained about 50lbs since she turned 50 despite working out regularly  Social History -States she does drink; roughly one drink a week  Medical History -Patient has experienced swelling in right LE for "3.4 years -Noticed a vein in her leg swelling noticeable and then her foot began to swell -States she is currently taking Furosemide for swelling, which increased her urination -Denies unilateral knee, hip or leg pain aside from "sitting too Matthews in children's chairs at work" -States she has worn tennis shoes for several years but this summer her foot has not been swelling as well  -Patient denies high BP in history; was very surprised by today's BP of 139/93 today -States she has been under a lot of stress lately but  did not elaborate  -Was diagnosed as prediabetic some time ago when she was working out very regularly  -Denies GERD -Has not had a colonoscopy  -States she does have seasonal allergies   Gyno -G/P/A= 1/1/0 -Has not had hysterectomy -Pt is still menstruating (last was on July 15) despite some menopausal symptoms including night sweats and mood swings  -Does not have GYN; sees PCP for gynocological needs      Wt Readings from Last 3 Encounters:  02/05/18 275 lb 8 oz (125 kg)   BP Readings from Last 3 Encounters:  02/07/18 (!) 146/90  02/05/18 (!) 139/93   Pulse Readings from Last 3 Encounters:  02/07/18 86  02/05/18 92   BMI Readings from Last 3 Encounters:  02/05/18 43.15  kg/m    Patient Care Team    Relationship Specialty Notifications Start End  Thomasene Lot, DO PCP - General Family Medicine  02/05/18     Patient Active Problem List   Diagnosis Date Noted  . Elevated blood pressure reading 02/05/2018  . Prediabetes 02/05/2018  . Family history of thyroid disease 02/05/2018  . Environmental and seasonal allergies 02/05/2018  . Venous insufficiency (chronic) (peripheral)- R >etr L LExt 02/05/2018  . Vitamin D deficiency 02/05/2018     History reviewed. No pertinent past medical history.   History reviewed. No pertinent past medical history.   Past Surgical History:  Procedure Laterality Date  . CESAREAN SECTION  2004     Family History  Problem Relation Age of Onset  . Diabetes Mother   . Hypertension Mother   . Cancer Father      Social History   Substance and Sexual Activity  Drug Use Never     Social History   Substance and Sexual Activity  Alcohol Use Yes  . Alcohol/week: 1.0 standard drinks  . Types: 1 Standard drinks or equivalent per week     Social History   Tobacco Use  Smoking Status Never Smoker  Smokeless Tobacco Never Used     Current Meds  Medication Sig  . Cholecalciferol (VITAMIN D3) 50000 units CAPS Take 1 capsule by mouth daily.  . furosemide (LASIX) 20 MG tablet Take 1 tablet by mouth daily.  Marland Kitchen loratadine (ALLERGY RELIEF) 10 MG tablet Take 10 mg by mouth daily.  . Multiple Vitamin (MULTIVITAMIN WITH MINERALS) TABS tablet Take 1 tablet by mouth daily.    Allergies: Codeine   Review of Systems  Constitutional: Negative for chills, diaphoresis, fever, malaise/fatigue and weight loss.  HENT: Negative for congestion, sore throat and tinnitus.   Eyes: Negative for blurred vision, double vision and photophobia.  Respiratory: Negative for cough and wheezing.   Cardiovascular: Negative for chest pain and palpitations.  Gastrointestinal: Negative for blood in stool, diarrhea, nausea and  vomiting.  Genitourinary: Negative for dysuria, frequency and urgency.  Musculoskeletal: Negative for joint pain and myalgias.  Skin: Negative for itching and rash.  Neurological: Negative for dizziness, focal weakness, weakness and headaches.  Endo/Heme/Allergies: Positive for environmental allergies (chronic). Negative for polydipsia. Does not bruise/bleed easily.  Psychiatric/Behavioral: Negative for depression and memory loss. The patient is nervous/anxious (chronic). The patient does not have insomnia.      Objective:   Blood pressure (!) 139/93, pulse 92, height 5\' 7"  (1.702 m), weight 275 lb 8 oz (125 kg), SpO2 100 %. Body mass index is 43.15 kg/m. General: Well Developed, well nourished, and in no acute distress.  Neuro: Alert and oriented x3, extra-ocular muscles intact, sensation grossly  intact.  HEENT:[place]/AT, PERRLA, neck supple, No carotid bruits Skin: no gross rashes  Cardiac: Regular rate and rhythm Respiratory: Essentially clear to auscultation bilaterally. Not using accessory muscles, speaking in full sentences.  Abdominal: not grossly distended Musculoskeletal: Ambulates w/o diff, FROM * 4 ext.  Vasc: less 2 sec cap RF, warm and pink  Psych:  No HI/SI, judgement and insight good, Euthymic mood. Full Affect.    Recent Results (from the past 2160 hour(s))  TB Skin Test     Status: Normal   Collection Time: 02/07/18  4:02 PM  Result Value Ref Range   TB Skin Test Negative    Induration 0 mm

## 2018-02-07 ENCOUNTER — Ambulatory Visit (INDEPENDENT_AMBULATORY_CARE_PROVIDER_SITE_OTHER): Payer: BC Managed Care – PPO | Admitting: Family Medicine

## 2018-02-07 VITALS — BP 146/90 | HR 86

## 2018-02-07 DIAGNOSIS — Z111 Encounter for screening for respiratory tuberculosis: Secondary | ICD-10-CM | POA: Diagnosis not present

## 2018-02-07 LAB — TB SKIN TEST
INDURATION: 0 mm
TB Skin Test: NEGATIVE

## 2018-02-07 NOTE — Progress Notes (Signed)
Patient here for PPD read.  0mm negative. MPulliam, CMA/RT(R)

## 2018-03-12 LAB — HEMOGLOBIN A1C: Hemoglobin A1C: 6

## 2018-03-12 LAB — TSH: TSH: 1.42 (ref 0.41–5.90)

## 2018-03-12 LAB — VITAMIN D 25 HYDROXY (VIT D DEFICIENCY, FRACTURES): VIT D 25 HYDROXY: 41.7

## 2018-04-03 ENCOUNTER — Other Ambulatory Visit: Payer: BC Managed Care – PPO

## 2018-04-06 ENCOUNTER — Ambulatory Visit: Payer: BC Managed Care – PPO | Admitting: Family Medicine

## 2018-04-13 ENCOUNTER — Ambulatory Visit: Payer: BC Managed Care – PPO | Admitting: Family Medicine

## 2018-05-08 ENCOUNTER — Other Ambulatory Visit: Payer: BC Managed Care – PPO

## 2018-05-15 ENCOUNTER — Ambulatory Visit: Payer: BC Managed Care – PPO | Admitting: Family Medicine

## 2018-07-31 ENCOUNTER — Other Ambulatory Visit: Payer: Self-pay | Admitting: Nurse Practitioner

## 2018-08-02 ENCOUNTER — Telehealth: Payer: Self-pay | Admitting: Family Medicine

## 2018-08-02 NOTE — Telephone Encounter (Signed)
Patient has only had est care appt in 02/2018.  Medication was last filled by pervious provider. Called patient and made appointment for 08/07/2018 @ 1 PM.  Patient states that she has enough medication to get her through to the appointment. MPulliam, CMA/RT(R)

## 2018-08-02 NOTE — Telephone Encounter (Signed)
Patient came by office to request refill on :  furosemide (LASIX) 20 MG tablet [[id number]]   Order Details  Dose: 1 tablet Route: Oral Frequency: Daily  Dispense Quantity: -- Refills: -- Fills remaining: --        Sig: Take 1 tablet by mouth daily.          ---Forwarding message to medical assistant that if approved pls send to :   [facility] [address] [place] - [address] [phone] (Phone) [phone] (Fax)   --glh

## 2018-08-07 ENCOUNTER — Ambulatory Visit (INDEPENDENT_AMBULATORY_CARE_PROVIDER_SITE_OTHER): Payer: BC Managed Care – PPO | Admitting: Family Medicine

## 2018-08-07 ENCOUNTER — Encounter: Payer: Self-pay | Admitting: Family Medicine

## 2018-08-07 DIAGNOSIS — Z9189 Other specified personal risk factors, not elsewhere classified: Secondary | ICD-10-CM

## 2018-08-07 DIAGNOSIS — Z91199 Patient's noncompliance with other medical treatment and regimen due to unspecified reason: Secondary | ICD-10-CM

## 2018-08-07 DIAGNOSIS — Z91148 Patient's other noncompliance with medication regimen for other reason: Secondary | ICD-10-CM | POA: Insufficient documentation

## 2018-08-07 DIAGNOSIS — E559 Vitamin D deficiency, unspecified: Secondary | ICD-10-CM | POA: Diagnosis not present

## 2018-08-07 DIAGNOSIS — I872 Venous insufficiency (chronic) (peripheral): Secondary | ICD-10-CM

## 2018-08-07 DIAGNOSIS — Z91119 Patient's noncompliance with dietary regimen due to unspecified reason: Secondary | ICD-10-CM | POA: Insufficient documentation

## 2018-08-07 DIAGNOSIS — E6609 Other obesity due to excess calories: Secondary | ICD-10-CM | POA: Insufficient documentation

## 2018-08-07 DIAGNOSIS — R2241 Localized swelling, mass and lump, right lower limb: Secondary | ICD-10-CM

## 2018-08-07 DIAGNOSIS — R030 Elevated blood-pressure reading, without diagnosis of hypertension: Secondary | ICD-10-CM | POA: Diagnosis not present

## 2018-08-07 DIAGNOSIS — Z9114 Patient's other noncompliance with medication regimen: Secondary | ICD-10-CM

## 2018-08-07 DIAGNOSIS — R7303 Prediabetes: Secondary | ICD-10-CM | POA: Diagnosis not present

## 2018-08-07 DIAGNOSIS — Z9111 Patient's noncompliance with dietary regimen: Secondary | ICD-10-CM

## 2018-08-07 NOTE — Patient Instructions (Addendum)
Look into [employer] shoes.  -Great job on losing some weight since January 1.  You might find some information below helpful.   Also, if you would like to go to the healthy [facility] and wellness [facility] which [facility] a great weight loss program, please let us know and we can place that referral.  Also, if you change your mind about going to be evaluated for obstructive sleep apnea, please let us know.   Behavior Modification Ideas for Weight Management  Weight management involves adopting a healthy lifestyle that includes a knowledge of nutrition and exercise, a positive attitude and the right kind of motivation. Internal motives such as better health, increased energy, self-esteem and personal control increase your chances of lifelong weight management success.  Remember to have realistic goals and think long-term success. Believe in yourself and you can do it. The following information will give you ideas to help you meet your goals.  Control Your Home Environment  Eat only while sitting down at the kitchen or dining room table. Do not eat while watching television, reading, cooking, talking on the phone, standing at the refrigerator or working on the computer. Keep tempting foods out of the house -- don't buy them. Keep tempting foods out of sight. Have low-calorie foods ready to eat. Unless you are preparing a meal, stay out of the kitchen. Have healthy snacks at your disposal, such as small pieces of fruit, vegetables, canned fruit, pretzels, low-fat string cheese and nonfat cottage cheese.  Control Your Work Environment  Do not eat at [employer] desk or keep tempting snacks at your desk. If you get hungry between meals, plan healthy snacks and bring them with you to work. During your breaks, go for a walk instead of eating. If you work around food, plan in advance the one item you will eat at mealtime. Make it inconvenient to nibble on food by chewing gum, sugarless candy or  drinking water or another low-calorie beverage. Do not work through meals. Skipping meals slows down metabolism and may result in overeating at the next meal. If food is available for special occasions, either pick the healthiest item, nibble on low-fat snacks brought from home, don't have anything offered, choose one option and have a small amount, or have only a beverage.  Control Your Mealtime Environment  Serve your plate of food at the stove or kitchen counter. Do not put the serving dishes on the table. If you do put dishes on the table, remove them immediately when finished eating. Fill half of your plate with vegetables, a quarter with lean protein and a quarter with starch. Use smaller plates, bowls and glasses. A smaller portion will look large when it is in a little dish. Politely refuse second helpings. When fixing your plate, limit portions of food to one scoop/serving or less.   Daily Food Management  Replace eating with another activity that you will not associate with food. Wait 20 minutes before eating something you are craving. Drink a large glass of water or diet soda before eating. Always have a big glass or bottle of water to drink throughout the day. Avoid high-calorie add-ons such as cream with your coffee, butter, mayonnaise and salad dressings.  Shopping: Do not shop when hungry or tired. Shop from a list and avoid buying anything that is not on your list. If you must have tempting foods, buy individual-sized packages and try to find a lower-calorie alternative. Don't taste test in the store. Read food  labels. Compare products to help you make the healthiest choices.  Preparation: Chew a piece of gum while cooking meals. Use a quarter teaspoon if you taste test your food. Try to only fix what you are going to eat, leaving yourself no chance for seconds. If you have prepared more food than you need, portion it into individual containers and freeze or  refrigerate immediately. Don't snack while cooking meals.  Eating: Eat slowly. Remember it takes about 20 minutes for your stomach to send a message to your brain that it is full. Don't let fake hunger make you think you need more. The ideal way to eat is to take a bite, put your utensil down, take a sip of water, cut your next bite, take a bit, put your utensil down and so on. Do not cut your food all at one time. Cut only as needed. Take small bites and chew your food well. Stop eating for a minute or two at least once during a meal or snack. Take breaks to reflect and have conversation.  Cleanup and Leftovers: Label leftovers for a specific meal or snack. Freeze or refrigerate individual portions of leftovers. Do not clean up if you are still hungry.  Eating Out and Social Eating  Do not arrive hungry. Eat something light before the meal. Try to fill up on low-calorie foods, such as vegetables and fruit, and eat smaller portions of the high-calorie foods. Eat foods that you like, but choose small portions. If you want seconds, wait at least 20 minutes after you have eaten to see if you are actually hungry or if your eyes are bigger than your stomach. Limit alcoholic beverages. Try a soda water with a twist of lime. Do not skip other meals in the day to save room for the special event.  At Restaurants: Order  la carte rather than buffet style. Order some vegetables or a salad for an appetizer instead of eating bread. If you order a high-calorie dish, share it with someone. Try an after-dinner mint with your coffee. If you do have dessert, share it with two or more people. Don't overeat because you do not want to waste food. Ask for a doggie bag to take extra food home. Tell the server to put half of your entree in a to go bag before the meal is served to you. Ask for salad dressing, gravy or high-fat sauces on the side. Dip the tip of your fork in the dressing before each bite. If  bread is served, ask for only one piece. Try it plain without butter or oil. At [employer] where oil and vinegar is served with bread, use only a small amount of oil and a lot of vinegar for dipping.  At a Friend's House: Offer to bring a dish, appetizer or dessert that is low in calories. Serve yourself small portions or tell the host that you only want a small amount. Stand or sit away from the snack table. Stay away from the kitchen or stay busy if you are near the food. Limit your alcohol intake.  At [employer] and Cafeterias: Cover most of your plate with lettuce and/or vegetables. Use a salad plate instead of a dinner plate. After eating, clear away your dishes before having coffee or tea.  Entertaining at Home: Explore low-fat, low-cholesterol cookbooks. Use single-serving foods like chicken breasts or hamburger patties. Prepare low-calorie appetizers and desserts.   Holidays: Keep tempting foods out of sight. Decorate the house without using food.  Have low-calorie beverages and foods on hand for guests. Allow yourself one planned treat a day. Don't skip meals to save up for the holiday feast. Eat regular, planned meals.   Exercise Well  Make exercise a priority and a planned activity in the day. If possible, walk the entire or part of the distance to work. Get an exercise buddy. Go for a walk with a colleague during one of your breaks, go to the gym, run or take a walk with a friend, walk in the mall with a shopping companion. Park at the end of the parking lot and walk to the store or office entrance. Always take the stairs all of the way or at least part of the way to your floor. If you have a desk job, walk around the office frequently. Do leg lifts while sitting at your desk. Do something outside on the weekends like going for a hike or a bike ride.   Have a Healthy Attitude  Make health your weight management priority. Be realistic. Have a goal to achieve  a healthier you, not necessarily the lowest weight or ideal weight based on calculations or tables. Focus on a healthy eating style, not on dieting. Dieting usually lasts for a short amount of time and rarely produces long-term success. Think long term. You are developing new healthy behaviors to follow next month, in a year and in a decade.    This information is for educational purposes only and is not intended to replace the advice of your doctor or health care provider. We encourage you to discuss with your doctor any questions or concerns you may have.        Guidelines for Losing Weight   We want weight loss that will last so you should lose 1-2 pounds a week.  THAT IS IT! Please pick THREE things a month to change. Once it is a habit check off the item. Then pick another three items off the list to become habits.  If you are already doing a habit on the list GREAT!  Cross that item off!  Dont drink your calories. Ie, alcohol, soda, fruit juice, and sweet tea.   Drink more water. Drink a glass when you feel hungry or before each meal.   Eat breakfast - Complex carb and protein (likeDannon light and fit yogurt, oatmeal, fruit, eggs, [place] bacon).  Measure your cereal.  Eat no more than one cup a day. (ie Kashi)  Eat an apple a day.  Add a vegetable a day.  Try a new vegetable a month.  Use Pam! Stop using oil or butter to cook.  Dont finish your plate or use smaller plates.  Share your dessert.  Eat sugar free Jello for dessert or frozen grapes.  Dont eat 2-3 hours before bed.  Switch to whole wheat bread, pasta, and brown rice.  Make healthier choices when you eat out. No fries!  Pick baked chicken, NOT fried.  Dont forget to SLOW DOWN when you eat. It is not going anywhere.   Take the stairs.  Park far away in the parking lot  Lift soup cans (or weights) for 10 minutes while watching TV.  Walk at work for 10 minutes during break.  Walk outside 1  time a week with your friend, kids, dog, or significant other.  Start a walking group at church.  Walk the mall as much as you can tolerate.   Keep a food diary.  Weigh yourself daily.  Walk  for 15 minutes 3 days per week.  Cook at home more often and eat out less. If life happens and you go back to old habits, it is okay.  Just start over. You can do it!  If you experience chest pain, get short of breath, or tired during the exercise, please stop immediately and inform your doctor.    Before you even begin to attack a weight-loss plan, it pays to remember this: You are not fat. You have fat. Losing weight isn't about blame or shame; it's simply another achievement to accomplish. Dieting is like any other skill--you have to buckle down and work at it. As long as you act in a smart, reasonable way, you'll ultimately get where you want to be. Here are some weight loss pearls for you.   1. It's Not a Diet. It's a Lifestyle Thinking of a diet as something you're on and suffering through only for the short term doesn't work. To shed weight and keep it off, you need to make permanent changes to the way you eat. It's OK to indulge occasionally, of course, but if you cut calories temporarily and then revert to your old way of eating, you'll gain back the weight quicker than you can say yo-yo. Use it to lose it. Research shows that one of the best predictors of long-term weight loss is how many pounds you drop in the first month. For that reason, nutritionists often suggest being stricter for the first two weeks of your new eating strategy to build momentum. Cut out added sugar and alcohol and avoid unrefined carbs. After that, figure out how you can reincorporate them in a way that's healthy and maintainable.  2. There's a Right Way to Exercise Working out burns calories and fat and boosts your metabolism by building muscle. But those trying to lose weight are notorious for overestimating the number  of calories they burn and underestimating the amount they take in. Unfortunately, your system is biologically programmed to hold on to extra pounds and that means when you start exercising, your body senses the deficit and ramps up its hunger signals. If you're not diligent, you'll eat everything you burn and then some. Use it, to lose it. Cardio gets all the exercise glory, but strength and interval training are the real heroes. They help you build lean muscle, which in turn increases your metabolism and calorie-burning ability 3. Don't Overreact to Mild Hunger Some people have a hard time losing weight because of hunger anxiety. To them, being hungry is bad--something to be avoided at all costs--so they carry snacks with them and eat when they don't need to. Others eat because they're stressed out or bored. While you never want to get to the point of being ravenous (that's when bingeing is likely to happen), a hunger pang, a craving, or the fact that it's 3:00 p.m. should not send you racing for the vending machine or obsessing about the energy bar in your purse. Ideally, you should put off eating until your stomach is growling and it's difficult to concentrate.  Use it to lose it. When you feel the urge to eat, use the HALT method. Ask yourself, Am I really hungry? Or am I angry or anxious, lonely or bored, or tired? If you're still not certain, try the apple test. If you're truly hungry, an apple should seem delicious; if it doesn't, something else is going on. Or you can try drinking water and making yourself busy, if you are  still hungry try a healthy snack.  4. Not All Calories Are Created Equal The mechanics of weight loss are pretty simple: Take in fewer calories than you use for energy. But the kind of food you eat makes all the difference. Processed food that's high in saturated fat and refined starch or sugar can cause inflammation that disrupts the hormone signals that tell your brain you're full.  The result: You eat a lot more.  Use it to lose it. Clean up your diet. Swap in whole, unprocessed foods, including vegetables, lean protein, and healthy fats that will fill you up and give you the biggest nutritional bang for your calorie buck. In a few weeks, as your brain starts receiving regular hunger and fullness signals once again, you'll notice that you feel less hungry overall and naturally start cutting back on the amount you eat.  5. Protein, Produce, and Plant-Based Fats Are Your Weight-Loss Trinity Here's why eating the three Ps regularly will help you drop pounds. Protein fills you up. You need it to build lean muscle, which keeps your metabolism humming so that you can torch more fat. People in a weight-loss program who ate double the recommended daily allowance for protein (about 110 grams for a 150-pound woman) lost 70 percent of their weight from fat, while people who ate the RDA lost only about 40 percent, one study found. Produce is packed with filling fiber. "It's very difficult to consume too many calories if you're eating a lot of vegetables. Example: Three cups of broccoli is a lot of food, yet only 93 calories. (Fruit is another story. It can be easy to overeat and can contain a lot of calories from sugar, so be sure to monitor your intake.) Plant-based fats like olive oil and those in avocados and nuts are healthy and extra satiating.  Use it to lose it. Aim to incorporate each of the three Ps into every meal and snack. People who eat protein throughout the day are able to keep weight off, according to a study in the American Journal of Clinical Nutrition. In addition to meat, poultry and seafood, good sources are beans, lentils, eggs, tofu, and yogurt. As for fat, keep portion sizes in check by measuring out salad dressing, oil, and nut butters (shoot for one to two tablespoons). Finally, eat veggies or a little fruit at every meal. People who did that consumed 308 fewer calories  but didn't feel any hungrier than when they didn't eat more produce.  7. How You Eat Is As Important As What You Eat In order for your brain to register that you're full, you need to focus on what you're eating. Sit down whenever you eat, preferably at a table. Turn off the TV or computer, put down your phone, and look at your food. Smell it. Chew slowly, and don't put another bite on your fork until you swallow. When women ate lunch this attentively, they consumed 30 percent less when snacking later than those who listened to an audiobook at lunchtime, according to a study in the [place] Journal of Nutrition. 8. Weighing Yourself Really Works The scale provides the best evidence about whether your efforts are paying off. Seeing the numbers tick up or down or stagnate is motivation to keep going--or to rethink your approach. A 2015 study at [facility] found that daily weigh-ins helped people lose more weight, keep it off, and maintain that loss, even after two years. Use it to lose it. Step on the scale  at the same time every day for the best results. If your weight shoots up several pounds from one weigh-in to the next, don't freak out. Eating a lot of salt the night before or having your period is the likely culprit. The number should return to normal in a day or two. It's a steady climb that you need to do something about. 9. Too Much Stress and Too Little Sleep Are Your Enemies When you're tired and frazzled, your body cranks up the production of cortisol, the stress hormone that can cause carb cravings. Not getting enough sleep also boosts your levels of ghrelin, a hormone associated with hunger, while suppressing leptin, a hormone that signals fullness and satiety. People on a diet who slept only five and a half hours a night for two weeks lost 55 percent less fat and were hungrier than those who slept eight and a half hours, according to a study in the [place] [employer] Journal. Use  it to lose it. Prioritize sleep, aiming for seven hours or more a night, which research shows helps lower stress. And make sure you're getting quality zzz's. If a snoring spouse or a fidgety cat wakes you up frequently throughout the night, you may end up getting the equivalent of just four hours of sleep, according to a study from [facility]. Keep pets out of the bedroom, and use a white-noise app to drown out snoring. 10. You Will Hit a plateau--And You Can Bust Through It As you slim down, your body releases much less leptin, the fullness hormone.  If you're not strength training, start right now. Building muscle can raise your metabolism to help you overcome a plateau. To keep your body challenged and burning calories, incorporate new moves and more intense intervals into your workouts or add another sweat session to your weekly routine. Alternatively, cut an extra 100 calories or so a day from your diet. Now that you've lost weight, your body simply doesn't need as much fuel.    Since food equals calories, in order to lose weight you must either eat fewer calories, exercise more to burn off calories with activity, or both. Food that is not used to fuel the body is stored as fat. A major component of losing weight is to make smarter food choices. Here's how:  1)   Limit non-nutritious foods, such as: Sugar, honey, syrups and candy Pastries, donuts, pies, cakes and cookies Soft drinks, sweetened juices and alcoholic beverages  2)  Cut down on high-fat foods by: - Choosing poultry, fish or lean red meat - Choosing low-fat cooking methods, such as baking, broiling, steaming, grilling and boiling - Using low-fat or non-fat dairy products - Using vinaigrette, herbs, lemon or fat-free salad dressings - Avoiding fatty meats, such as bacon, sausage, franks, ribs and luncheon meats - Avoiding high-fat snacks like nuts, chips and chocolate - Avoiding fried foods - Using less butter,  margarine, oil and mayonnaise - Avoiding high-fat gravies, cream sauces and cream-based soups  3) Eat a variety of foods, including: - Fruit and vegetables that are raw, steamed or baked - Whole grains, breads, cereal, rice and pasta - Dairy products, such as low-fat or non-fat milk or yogurt, low-fat cottage cheese and low-fat cheese - Protein-rich foods like chicken, [place], fish, lean meat and legumes, or beans  4) Change your eating habits by: - Eat three balanced meals a day to help control your hunger - Watch portion sizes and eat small servings of a  variety of foods - Choose low-calorie snacks - Eat only when you are hungry and stop when you are satisfied - Eat slowly and try not to perform other tasks while eating - Find other activities to distract you from food, such as walking, taking up a hobby or being involved in the community - Include regular exercise in your daily routine ( minimum of 20 min of moderate-intensity exercise at least 5 days/week)  - Find a support group, if necessary, for emotional support in your weight loss journey           Easy ways to cut 100 calories   1. Eat your eggs with hot sauce OR salsa instead of cheese.  Eggs are great for breakfast, but many people consider eggs and cheese to be BFFs. Instead of cheese--1 oz. of cheddar has 114 calories--top your eggs with hot sauce, which contains no calories and helps with satiety and metabolism. Salsa is also a great option!!  2. Top your toast, waffles or pancakes with fresh berries instead of jelly or syrup. Half a cup of berries--fresh, frozen or thawed--has about 40 calories, compared with 2 tbsp. of maple syrup or jelly, which both have about 100 calories. The berries will also give you a good punch of fiber, which helps keep you full and satisfied and wont spike blood sugar quickly like the jelly or syrup. 3. Swap the non-fat latte for Kolasa coffee with a splash of half-and-half. Contrary to  its name, that non-fat latte has 130 calories and a startling 19g of carbohydrates per 16 oz. serving. Replacing that light drinkable dessert with a Kroening coffee with a splash of half-and-half saves you more than 100 calories per 16 oz. serving. 4. Sprinkle salads with freeze-dried raspberries instead of dried cranberries. If you want a sweet addition to your nutritious salad, stay away from dried cranberries. They have a whopping 130 calories per  cup and 30g carbohydrates. Instead, sprinkle freeze-dried raspberries guilt-free and save more than 100 calories per  cup serving, adding 3g of belly-filling fiber. 5. Go for mustard in place of mayo on your sandwich. Mustard can add really nice flavor to any sandwich, and there are tons of varieties, from spicy to honey. A serving of mayo is 95 calories, versus 10 calories in a serving of mustard.  Or try an avocado mayo spread: You can find the recipe few click this link: https://www.californiaavocado.com/recipes/recipe-container/california-avocado-mayo 6. Choose a DIY salad dressing instead of the store-bought kind. Mix Dijon or whole grain mustard with low-fat Kefir or red wine vinegar and garlic. 7. Use hummus as a spread instead of a dip. Use hummus as a spread on a high-fiber cracker or tortilla with a sandwich and save on calories without sacrificing taste. 8. Pick just one salad accessory. Salad isnt automatically a calorie winner. Its easy to over-accessorize with toppings. Instead of topping your salad with nuts, avocado and cranberries (all three will clock in at 313 calories), just pick one. The next day, choose a different accessory, which will also keep your salad interesting. You dont wear all your jewelry every day, right? 9. Ditch the white pasta in favor of spaghetti squash. One cup of cooked spaghetti squash has about 40 calories, compared with traditional spaghetti, which comes with more than 200. Spaghetti squash is also  nutrient-dense. Its a good source of fiber and Vitamins A and C, and it can be eaten just like you would eat pasta--with a great tomato sauce and [place] meatballs or with  pesto, tofu and spinach, for example. 10. Dress up your chili, soups and stews with non-fat [place] yogurt instead of sour cream. Just a dollop of sour cream can set you back 115 calories and a whopping 12g of fat--seven of which are of the artery-clogging variety. Added bonus: [place] yogurt is packed with muscle-building protein, calcium and B Vitamins. 11. Mash cauliflower instead of mashed potatoes. One cup of traditional mashed potatoes--in all their creamy goodness--has more than 200 calories, compared to mashed cauliflower, which you can typically eat for less than 100 calories per 1 cup serving. Cauliflower is a great source of the antioxidant indole-3-carbinol (I3C), which may help reduce the risk of some cancers, like breast cancer. 12. Ditch the ice cream sundae in favor of a [place] yogurt parfait. Instead of a cup of ice cream or fro-yo for dessert, try 1 cup of nonfat Greek yogurt topped with fresh berries and a sprinkle of cacao nibs. Both toppings are packed with antioxidants, which can help reduce cellular inflammation and oxidative damage. And the comparison is a no-brainer: One cup of ice cream has about 275 calories; one cup of frozen yogurt has about 230; and a cup of Greek yogurt has just 130, plus twice the protein, so youre less likely to return to the freezer for a second helping. 13. Put olive oil in a spray container instead of using it directly from the bottle. Each tablespoon of olive oil is 120 calories and 15g of fat. Use a mister instead of pouring it straight into the pan or onto a salad. This allows for portion control and will save you more than 100 calories. 14. When baking, substitute canned pumpkin for butter or oil. Canned pumpkin--not pumpkin pie mix--is loaded with Vitamin A, which is important for  skin and eye health, as well as immunity. And the comparisons are pretty crazy:  cup of canned pumpkin has about 40 calories, compared to butter or oil, which has more than 800 calories. Yes, 800 calories. Applesauce and mashed banana can also serve as good substitutions for butter or oil, usually in a 1:1 ratio. 15. Top casseroles with high-fiber cereal instead of breadcrumbs. Breadcrumbs are typically made with white bread, while breakfast cereals contain 5-9g of fiber per serving. Not only will you save more than 150 calories per  cup serving, the swap will also keep you more full and youll get a metabolism boost from the added fiber. 16. Snack on pistachios instead of macadamia nuts. Believe it or not, you get the same amount of calories from 35 pistachios (100 calories) as you would from only five macadamia nuts. 17. Chow down on kale chips rather than potato chips. This is my favorite dont knock it till you try it swap. Kale chips are so easy to make at home, and you can spice them up with a little grated parmesan or chili powder. Plus, theyre a mere fraction of the calories of potato chips, but with the same crunch factor we crave so often. 18. Add seltzer and some fruit slices to your cocktail instead of soda or fruit juice. One cup of soda or fruit juice can pack on as much as 140 calories. Instead, use seltzer and fruit slices. The fruit provides valuable phytochemicals, such as flavonoids and anthocyanins, which help to combat cancer and stave off the aging process.

## 2018-08-07 NOTE — Progress Notes (Signed)
Impression and Recommendations:    1. Morbid obesity (HCC)   2. Vitamin D deficiency   3. Prediabetes   4. Elevated blood pressure reading   5. Venous insufficiency (chronic) (peripheral)- R >etr L LExt   6. Localized swelling of right foot   7. Noncompliance with diet and medication regimen   8.  High risk for OSA-  declines referral      - Need for blood work in near future. - Patient's preference is to discuss lab work in near future.  - Patient was told to follow up in 4-6 weeks on 02/05/2018, but did not. - Extensive discussion held with patient regarding continuity of care.   - Discussed need for patient to continue to obtain management and screenings as recommended and established.  Educated patient at length about the critical importance of keeping chronic care visits and health maintenance up to date.  - Participated in lengthy conversation and all questions were answered.  1. Blood Pressure - Elevated Blood Pressure Reading - BP is controlled at this time. - Discussed that goal blood pressure is under 130/80.  - Continue treatment plan as prescribed.  - Lifestyle changes such as dash diet and engaging in a regular exercise program discussed with patient.  Educational handouts provided.  - Ambulatory BP monitoring encouraged. Keep log and bring in next OV. - Will continue to monitor.  2. Unilateral Swelling; Right Lower Extremity/Foot - Discussed indications and possible causes of patient's condition.  - Explained that we cannot refill her lasix until we draw blood work and assess whether certain values, such as her potassium, kidney health, and electrolyte levels, are okay.  - Reviewed that lasix is not the ideal treatment for patient's condition, especially given its kidney-damaging properties.  Reviewed the need to figure out underlying cause of the problem, instead of applying lasix as a "band-aid" treatment.  - Advised trying custom orthotic inserts for  treatment.  - Referral placed to podiatry today.  - If symptoms do not improve on conservative treatment, recommended further study of the patient's right lower extremity in the future, such as through vascular studies.  3. Possible Sleep Apnea - Sleep Study Declined - Encouraged patient to do some reading about obstructive sleep apnea at home. - Advised patient to educate herself about the risks of sleep apnea, and the benefits of treatment.  - Patient knows she may return at any time for referral for sleep study. - Patient knows to let us know if she changes her mind about CPAP.  - At present, patient does not wish to go for sleep study.  4. BMI Counseling - BMI of 42.57, Morbid Obesity - Patient has recently lost weight from 280 down to 271. - Advised patient to continue prudent lifestyle changes.  - Advised patient to look into Weight Watchers as a program to help guide weight loss.  Explained to patient what BMI refers to, and what it means medically.    Told patient to think about it as a "medical risk stratification measurement" and how increasing BMI is associated with increasing risk/ or worsening state of various diseases such as hypertension, hyperlipidemia, diabetes, premature OA, depression etc.  American Heart Association guidelines for healthy diet, basically Mediterranean diet, and exercise guidelines of 30 minutes 5 days per week or more discussed in detail.  Health counseling performed.  All questions answered.  5. Lifestyle & Preventative Health Maintenance - Advised patient to continue working toward exercising to improve overall  mental, physical, and emotional health.    - Reviewed the "spokes of the wheel" of mood and health management.  Stressed the importance of ongoing prudent habits, including regular exercise, appropriate sleep hygiene, healthful dietary habits, and prayer/meditation to relax.  - Encouraged patient to engage in daily physical activity,  especially a formal exercise routine.  Recommended that the patient eventually strive for at least 150 minutes of moderate cardiovascular activity per week according to guidelines established by the [employer].   - Healthy dietary habits encouraged, including low-carb, and high amounts of lean protein in diet.   - Patient should also consume adequate amounts of water.  - Extensive education was provided and all questions were answered.   Orders Placed This Encounter  Procedures  . CBC with Differential/Platelet  . Comprehensive metabolic panel  . Hemoglobin A1c  . Lipid panel  . T4, free  . TSH  . VITAMIN D 25 Hydroxy (Vit-D Deficiency, Fractures)  . Direct LDL  . Ambulatory referral to Podiatry    Gross side effects, risk and benefits, and alternatives of medications and treatment plan in general discussed with patient.  Patient is aware that all medications have potential side effects and we are unable to predict every side effect or drug-drug interaction that may occur.   Patient will call with any questions prior to using medication if they have concerns.    Expresses verbal understanding and consents to current therapy and treatment regimen.  No barriers to understanding were identified.  Red flag symptoms and signs discussed in detail.  Patient expressed understanding regarding what to do in case of emergency\urgent symptoms  Please see AVS handed out to patient at the end of our visit for further patient instructions/ counseling done pertaining to today's office visit.   Return for 4 mo- BP, wt check or sooner if ABn labs.     Note:  This note was prepared with assistance of Dragon voice recognition software. Occasional wrong-word or sound-a-like substitutions may have occurred due to the inherent limitations of voice recognition software.   This document serves as a record of services personally performed by Thomasene Lot, DO. It was created on her behalf by Peggye Fothergill,  a trained medical scribe. The creation of this record is based on the scribe's personal observations and the provider's statements to them.   I have reviewed the above medical documentation for accuracy and completeness and I concur.  Thomasene Lot, DO 08/07/2018 5:04 PM        -------------------------------------------------------------------------------------------------------------------------------    Subjective:     HPI: Makayla Matthews is a 53 y.o. female who presents to [facility] today for issues as discussed below.  Had a sinus infection recently and went to see the Urgent Care, [facility].  States that her husband says she sleeps at night.  Has two friends that sleep with masks, and notes it seems like too much of a hassle to travel with it, etc.  Recent Weight Loss Was weighting 280 in the past, and weighed in at 271 today. She changed her style of eating; eating meats and vegetables. Avoiding fruits, and exercising.  Walks with her son, husband, and the dogs. Walking does not worsen her lower right extremity/foot swelling, because she is wearing tennis shoes.  Right Lower Extremity Swelling  Patient states she takes lasix to control swelling in her right foot.  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  Notes she is  on her feet all day.  Formerly saw Provider Christell ConstantMoore of [facility].  Patient states that in the past, she had "a blood vessel popping out and bulging in her right leg."  At the time, she was in the gym very often and was told she messed up that blood vessel.  She was prescribed lasix at that time.  Patient experiences swelling if she doesn't keep her tennis shoes on, or throughout the day.  If she's moving around a lot and doesn't take her medicine, her foot will start to hurt; "discomfort I would say."  Denies swelling in the joint or foot in the morning.  The swelling occurs throughout  the day as she is up and on the foot.  Denies ankle pain, bony pain, pain or discomfort on the bottom of her foot.  Patient has never consulted with a podiatrist.  She is unsure if she has ever had a vascular study done of her right lower extremity.  She used compression stockings at one time to help control her sx, but she did not like the pressure.  "They, to me, were worse than going with them."  When she wears her dress shoes, that's when her swelling gets really bad; and this is only after eating oatmeal for breakfast and going to church.  1. HTN HPI:  -  Her blood pressure has been controlled at home.  Pt has been checking it at home.  Took a log of her blood pressures at home. but stopped a while ago.  137/92 142/97 139/93 140/97 138/92 149/100 128/87 135/90 114/77 122/81 117/77 112/76  - Patient reports good compliance with blood pressure medications  - Denies medication S-E   - Smoking Status noted   - She denies new or unusual onset of: chest pain, exercise intolerance, shortness of breath, dizziness, visual changes, headache, lower extremity swelling or claudication.   Last 3 blood pressure readings in our office are as follows: BP Readings from Last 3 Encounters:  08/07/18 130/89  02/07/18 (!) 146/90  02/05/18 (!) 139/93    Filed Weights   08/07/18 1310  Weight: 271 lb 12.8 oz (123.3 kg)     Wt Readings from Last 3 Encounters:  08/07/18 271 lb 12.8 oz (123.3 kg)  02/05/18 275 lb 8 oz (125 kg)   BP Readings from Last 3 Encounters:  08/07/18 130/89  02/07/18 (!) 146/90  02/05/18 (!) 139/93   Pulse Readings from Last 3 Encounters:  08/07/18 98  02/07/18 86  02/05/18 92   BMI Readings from Last 3 Encounters:  08/07/18 42.57 kg/m  02/05/18 43.15 kg/m     Patient Care Team    Relationship Specialty Notifications Start End  Thomasene Lotpalski, Delesha Pohlman, DO PCP - General Family Medicine  02/05/18      Patient Active Problem List   Diagnosis Date Noted    . Morbid obesity (HCC) 08/07/2018  . Localized swelling of right foot 08/07/2018  . Noncompliance with diet and medication regimen 08/07/2018  .  High risk for OSA-  declines referral 08/07/2018  . Elevated blood pressure reading 02/05/2018  . Prediabetes 02/05/2018  . Family history of thyroid disease 02/05/2018  . Environmental and seasonal allergies 02/05/2018  . Venous insufficiency (chronic) (peripheral)- R >etr L LExt 02/05/2018  . Vitamin D deficiency 02/05/2018    Past Medical history, Surgical history, Family history, Social history, Allergies and Medications have been entered into the medical record, reviewed and changed as needed.    No outpatient medications  have been marked as taking for the 08/07/18 encounter (Office Visit) with Thomasene Lot, DO.    Allergies:  Allergies  Allergen Reactions  . Codeine Itching     Review of Systems:  A fourteen system review of systems was performed and found to be positive as per HPI.   Objective:   Blood pressure 130/89, pulse 98, temperature 98.6 F (37 C), height 5\' 7"  (1.702 m), weight 271 lb 12.8 oz (123.3 kg), last menstrual period 07/07/2018, SpO2 100 %. Body mass index is 42.57 kg/m. General:  Well Developed, well nourished, appropriate for stated age.  Neuro:  Alert and oriented,  extra-ocular muscles intact  HEENT:  Normocephalic, atraumatic, neck supple, no carotid bruits appreciated  Skin:  no gross rash, warm, pink. Cardiac:  RRR, S1 S2 Respiratory:  ECTA B/L and A/P, Not using accessory muscles, speaking in full sentences- unlabored. Vascular:  Ext warm, no cyanosis apprec.; cap RF less 2 sec. Psych:  No HI/SI, judgement and insight good, Euthymic mood. Full Affect. Right Foot: Diffuse swelling just at distal right lower extremity, around ankle joint and top of foot.  No rash, no bony tenderness.

## 2018-08-08 LAB — T4, FREE: Free T4: 1.32 ng/dL (ref 0.82–1.77)

## 2018-08-08 LAB — COMPREHENSIVE METABOLIC PANEL WITH GFR
ALT: 30 IU/L (ref 0–32)
AST: 24 IU/L (ref 0–40)
Albumin/Globulin Ratio: 1.5 (ref 1.2–2.2)
Albumin: 4.5 g/dL (ref 3.8–4.9)
Alkaline Phosphatase: 103 IU/L (ref 39–117)
BUN/Creatinine Ratio: 15 (ref 9–23)
BUN: 11 mg/dL (ref 6–24)
Bilirubin Total: 0.5 mg/dL (ref 0.0–1.2)
CO2: 23 mmol/L (ref 20–29)
Calcium: 10.1 mg/dL (ref 8.7–10.2)
Chloride: 98 mmol/L (ref 96–106)
Creatinine, Ser: 0.75 mg/dL (ref 0.57–1.00)
GFR calc Af Amer: 105 mL/min/1.73
GFR calc non Af Amer: 91 mL/min/1.73
Globulin, Total: 3 g/dL (ref 1.5–4.5)
Glucose: 92 mg/dL (ref 65–99)
Potassium: 4.3 mmol/L (ref 3.5–5.2)
Sodium: 139 mmol/L (ref 134–144)
Total Protein: 7.5 g/dL (ref 6.0–8.5)

## 2018-08-08 LAB — CBC WITH DIFFERENTIAL/PLATELET
Basophils Absolute: 0.1 x10E3/uL (ref 0.0–0.2)
Basos: 1 %
EOS (ABSOLUTE): 0.4 x10E3/uL (ref 0.0–0.4)
Eos: 5 %
Hematocrit: 38.3 % (ref 34.0–46.6)
Hemoglobin: 12.8 g/dL (ref 11.1–15.9)
Immature Grans (Abs): 0 x10E3/uL (ref 0.0–0.1)
Immature Granulocytes: 0 %
Lymphocytes Absolute: 2.8 x10E3/uL (ref 0.7–3.1)
Lymphs: 34 %
MCH: 28.1 pg (ref 26.6–33.0)
MCHC: 33.4 g/dL (ref 31.5–35.7)
MCV: 84 fL (ref 79–97)
Monocytes Absolute: 0.7 x10E3/uL (ref 0.1–0.9)
Monocytes: 9 %
Neutrophils Absolute: 4.3 x10E3/uL (ref 1.4–7.0)
Neutrophils: 51 %
Platelets: 448 x10E3/uL (ref 150–450)
RBC: 4.55 x10E6/uL (ref 3.77–5.28)
RDW: 14 % (ref 11.7–15.4)
WBC: 8.3 x10E3/uL (ref 3.4–10.8)

## 2018-08-08 LAB — TSH: TSH: 1.77 u[IU]/mL (ref 0.450–4.500)

## 2018-08-08 LAB — LIPID PANEL
CHOL/HDL RATIO: 2.6 ratio (ref 0.0–4.4)
Cholesterol, Total: 144 mg/dL (ref 100–199)
HDL: 55 mg/dL (ref >39)
LDL Calculated: 76 mg/dL (ref 0–99)
Triglycerides: 63 mg/dL (ref 0–149)
VLDL Cholesterol Cal: 13 mg/dL (ref 5–40)

## 2018-08-08 LAB — LDL CHOLESTEROL, DIRECT: LDL Direct: 78 mg/dL (ref 0–99)

## 2018-08-08 LAB — HEMOGLOBIN A1C
Est. average glucose Bld gHb Est-mCnc: 131 mg/dL
HEMOGLOBIN A1C: 6.2 % — AB (ref 4.8–5.6)

## 2018-08-08 LAB — VITAMIN D 25 HYDROXY (VIT D DEFICIENCY, FRACTURES): VIT D 25 HYDROXY: 52.5 ng/mL (ref 30.0–100.0)

## 2018-08-09 ENCOUNTER — Encounter: Payer: Self-pay | Admitting: Family Medicine

## 2018-09-03 ENCOUNTER — Ambulatory Visit (INDEPENDENT_AMBULATORY_CARE_PROVIDER_SITE_OTHER): Payer: BC Managed Care – PPO

## 2018-09-03 ENCOUNTER — Encounter: Payer: Self-pay | Admitting: Podiatry

## 2018-09-03 ENCOUNTER — Ambulatory Visit: Payer: BC Managed Care – PPO | Admitting: Podiatry

## 2018-09-03 DIAGNOSIS — R609 Edema, unspecified: Secondary | ICD-10-CM

## 2018-09-03 DIAGNOSIS — I872 Venous insufficiency (chronic) (peripheral): Secondary | ICD-10-CM | POA: Diagnosis not present

## 2018-09-03 NOTE — Patient Instructions (Signed)
I have ordered an ultrasound (venous reflux study) to check the veins in your legs. If you do not hear from anyone by the end of the week about scheduling, please give me a call  If was nice to meet you today. If you have any questions or any further concerns, please feel fee to give me a call. You can call our office at [phone] or please feel fee to send me a message through MyChart.

## 2018-09-04 ENCOUNTER — Telehealth: Payer: Self-pay | Admitting: *Deleted

## 2018-09-04 DIAGNOSIS — R609 Edema, unspecified: Secondary | ICD-10-CM

## 2018-09-04 DIAGNOSIS — I872 Venous insufficiency (chronic) (peripheral): Secondary | ICD-10-CM

## 2018-09-04 NOTE — Telephone Encounter (Signed)
-----   Message from Vivi Barrack, DPM sent at 09/03/2018  3:37 PM EST ----- Can you please order a venous reflux study due to swelling x 8 years on the right leg. Thanks.

## 2018-09-04 NOTE — Telephone Encounter (Signed)
[facility] states they schedule through Epic and also faxed.

## 2018-09-05 NOTE — Progress Notes (Signed)
Subjective:   Patient ID: Makayla Matthews, female   DOB: 53 y.o.   MRN: [id number]   HPI 53 year old female presents the office today for concerns of swelling to the right foot ankle and the leg which is been on the for last 8 years.  She states that she previously was given fluid pills which did not help.  She says he gets worse when it is hot outside.  She denies any pain associated with it.  She has recently tried compression socks but this is not been helpful.  She states that when she wears sandals she notices the swelling more.  She does not get specific pain but she feels some pressure with increased swelling.  She has no other concerns.   Review of Systems  All other systems reviewed and are negative.  History reviewed. No pertinent past medical history.  Past Surgical History:  Procedure Laterality Date  . CESAREAN SECTION  2004     Current Outpatient Medications:  .  Cholecalciferol (VITAMIN D3) 50000 units CAPS, Take 1 capsule by mouth daily., Disp: , Rfl:  .  furosemide (LASIX) 20 MG tablet, Take 1 tablet by mouth daily., Disp: , Rfl:  .  loratadine (ALLERGY RELIEF) 10 MG tablet, Take 10 mg by mouth daily., Disp: , Rfl:  .  Multiple Vitamin (MULTIVITAMIN WITH MINERALS) TABS tablet, Take 1 tablet by mouth daily., Disp: , Rfl:   Allergies  Allergen Reactions  . Codeine Itching         Objective:  Physical Exam  General: AAO x3, NAD  Dermatological: Skin is warm, dry and supple bilateral. Nails x 10 are well manicured; remaining integument appears unremarkable at this time. There are no open sores, no preulcerative lesions, no rash or signs of infection present.  Vascular: Dorsalis Pedis artery and Posterior Tibial artery pedal pulses are 2/4 bilateral with immedate capillary fill time.  No varicosities and no lower extremity edema present bilateral.  There is swelling present the leg, there is no erythema or warmth.  There is no pain with calf  compression.  Neruologic: Grossly intact via light touch bilateral. Protective threshold with Semmes Wienstein monofilament intact to all pedal sites bilateral.   Musculoskeletal: No gross boney pedal deformities bilateral. No pain, crepitus, or limitation noted with foot and ankle range of motion bilateral. Muscular strength 5/5 in all groups tested bilateral.  Gait: Unassisted, Nonantalgic.       Assessment:   Right leg chronic swelling; venous insufficiency    Plan:  -Treatment options discussed including all alternatives, risks, and complications -Etiology of symptoms were discussed -X-rays were obtained and reviewed with the patient.  No evidence of acute fracture or stress fracture. -I ordered a venous reflux study.  We discussed treatment options will await the results of the ultrasound.  If abnormal refer to pain specialist.  If negative will further investigate.  Vivi Barrack DPM

## 2018-09-07 ENCOUNTER — Telehealth (HOSPITAL_COMMUNITY): Payer: Self-pay | Admitting: *Deleted

## 2018-09-07 NOTE — Telephone Encounter (Signed)
09/07/18 left msg asking for call back to schedule appt.

## 2018-09-14 ENCOUNTER — Inpatient Hospital Stay (HOSPITAL_COMMUNITY): Admission: RE | Admit: 2018-09-14 | Payer: BC Managed Care – PPO | Source: Ambulatory Visit

## 2018-12-03 ENCOUNTER — Telehealth (HOSPITAL_COMMUNITY): Payer: Self-pay | Admitting: *Deleted

## 2018-12-03 NOTE — Telephone Encounter (Addendum)
Asked COVID questions - pt answered no to all

## 2018-12-06 ENCOUNTER — Ambulatory Visit: Payer: BC Managed Care – PPO | Admitting: Family Medicine

## 2018-12-10 ENCOUNTER — Ambulatory Visit: Payer: BC Managed Care – PPO | Admitting: Family Medicine

## 2018-12-11 ENCOUNTER — Other Ambulatory Visit: Payer: Self-pay

## 2018-12-11 ENCOUNTER — Ambulatory Visit (HOSPITAL_COMMUNITY)
Admission: RE | Admit: 2018-12-11 | Discharge: 2018-12-11 | Disposition: A | Discharge status: Home / Self Care | Payer: BC Managed Care – PPO | Source: Ambulatory Visit | Attending: Family | Admitting: Family

## 2018-12-11 DIAGNOSIS — I872 Venous insufficiency (chronic) (peripheral): Secondary | ICD-10-CM | POA: Diagnosis present

## 2018-12-11 DIAGNOSIS — R609 Edema, unspecified: Secondary | ICD-10-CM | POA: Diagnosis present

## 2018-12-20 ENCOUNTER — Telehealth: Payer: Self-pay | Admitting: *Deleted

## 2018-12-20 ENCOUNTER — Telehealth: Payer: Self-pay | Admitting: Family Medicine

## 2018-12-20 DIAGNOSIS — R609 Edema, unspecified: Secondary | ICD-10-CM

## 2018-12-20 DIAGNOSIS — I872 Venous insufficiency (chronic) (peripheral): Secondary | ICD-10-CM

## 2018-12-20 NOTE — Telephone Encounter (Signed)
Pt contacted in Telephone Call.

## 2018-12-20 NOTE — Telephone Encounter (Signed)
-----   Message from Trula Slade, DPM sent at 12/20/2018 12:08 PM EDT ----- It seems that the veins are all visualized except one. No blood clot. I would refer her to a [place] Specialist at this time to further look for venous insuffiencey given the chronic leg swelling.

## 2018-12-20 NOTE — Telephone Encounter (Signed)
Sent report to Dr. Raliegh Scarlet to review and advise. MPulliam, CMA/RT(R)

## 2018-12-20 NOTE — Telephone Encounter (Signed)
Can you also please call her to let her know? Please apologize for the delay.

## 2018-12-20 NOTE — Telephone Encounter (Signed)
Patient was referred to podiatry by [place], the podiatrist sent her for a lower extremity ultrasound on 12/11/18. Patient has called multiple times to the podiatry office and left multiple VMs asking for results and gets no call back. She was wondering if Dr. Jenetta Downer could help interpret results as patient is getting worried that something may be wrong. Makayla Matthews is also going to call their office to see why patient has not be contacted yet. Please advise

## 2018-12-20 NOTE — Telephone Encounter (Signed)
Pt's PCP office called states pt had testing and would like to know when she would get the results. I asked A. Tamala Julian - Scheduler to tell PCP staff I would inform Dr. Jacqualyn Posey the results were available for review and our office would call pt once reviewed.

## 2018-12-20 NOTE — Telephone Encounter (Signed)
I informed pt of Dr. Leigh Aurora review of results and referral to [facility], [address] [place], [phone]. I apologized for the delay of results.

## 2018-12-20 NOTE — Telephone Encounter (Signed)
It seems that the veins are all visulaized except one. No blood clot. I would refer her to a [place] [facility]ist at this time to further look for venous insuffiencey given the chronic leg swelling.   Also she said she called here multiple times and never got a call back. I have not received any call from her.

## 2018-12-20 NOTE — Telephone Encounter (Signed)
Faxed referral clinicals, and demographics to [facility].

## 2019-01-15 ENCOUNTER — Encounter: Payer: Self-pay | Admitting: Family Medicine

## 2019-01-15 ENCOUNTER — Other Ambulatory Visit: Payer: Self-pay

## 2019-01-15 ENCOUNTER — Ambulatory Visit (INDEPENDENT_AMBULATORY_CARE_PROVIDER_SITE_OTHER): Payer: BC Managed Care – PPO | Admitting: Family Medicine

## 2019-01-15 VITALS — BP 142/88 | HR 98 | Temp 98.7°F | Ht 67.0 in | Wt 275.3 lb

## 2019-01-15 DIAGNOSIS — Z820 Family history of epilepsy and other diseases of the nervous system: Secondary | ICD-10-CM

## 2019-01-15 DIAGNOSIS — R030 Elevated blood-pressure reading, without diagnosis of hypertension: Secondary | ICD-10-CM

## 2019-01-15 DIAGNOSIS — E559 Vitamin D deficiency, unspecified: Secondary | ICD-10-CM

## 2019-01-15 DIAGNOSIS — R7303 Prediabetes: Secondary | ICD-10-CM | POA: Diagnosis not present

## 2019-01-15 DIAGNOSIS — F4323 Adjustment disorder with mixed anxiety and depressed mood: Secondary | ICD-10-CM

## 2019-01-15 DIAGNOSIS — F5104 Psychophysiologic insomnia: Secondary | ICD-10-CM

## 2019-01-15 DIAGNOSIS — I872 Venous insufficiency (chronic) (peripheral): Secondary | ICD-10-CM

## 2019-01-15 DIAGNOSIS — Z833 Family history of diabetes mellitus: Secondary | ICD-10-CM

## 2019-01-15 LAB — POCT GLYCOSYLATED HEMOGLOBIN (HGB A1C): Hemoglobin A1C: 6.2 % — AB (ref 4.0–5.6)

## 2019-01-15 MED ORDER — TRAZODONE HCL 100 MG PO TABS: 50.0000 mg | ORAL_TABLET | Freq: Every evening | ORAL | 1 refills | Status: DC | PRN

## 2019-01-15 MED ORDER — FLUOXETINE HCL 20 MG PO TABS: ORAL_TABLET | ORAL | 1 refills | Status: DC

## 2019-01-15 NOTE — Progress Notes (Signed)
Impression and Recommendations:    1. Elevated blood pressure reading   2. Morbid obesity (HCC)   3. Prediabetes- last A1c February was 6.2   4. Vitamin D deficiency   5. Venous insufficiency (chronic) (peripheral)- R >etr L LExt   6. Family history of type 2 diabetes mellitus in mother   637. Family history of Alzheimer's disease- mother passed age 53   8. Adjustment disorder with mixed anxiety and depressed mood   9. Psychophysiological insomnia     Elevated blood pressure reading - Plan: Amb Ref to Medical Weight Management  Morbid obesity (HCC) - Plan: Amb Ref to Medical Weight Management  Prediabetes- last A1c February was 6.2 - Plan: Amb Ref to Medical Weight Management, POCT glycosylated hemoglobin (Hb A1C)  Vitamin D deficiency - Plan: Amb Ref to Medical Weight Management  Venous insufficiency (chronic) (peripheral)- R >etr L LExt - Plan: Amb Ref to Medical Weight Management  Family history of type 2 diabetes mellitus in mother  Family history of Alzheimer's disease- mother passed age 53  Adjustment disorder with mixed anxiety and depressed mood - Plan: FLUoxetine (PROZAC) 20 MG tablet  Psychophysiological insomnia - Plan: traZODone (DESYREL) 100 MG tablet, FLUoxetine (PROZAC) 20 MG tablet  There are no diagnoses linked to this encounter.    Education and routine counseling performed. Handouts provided.  Orders Placed This Encounter  Procedures   Amb Ref to Medical Weight Management   POCT glycosylated hemoglobin (Hb A1C)    Meds ordered this encounter  Medications   traZODone (DESYREL) 100 MG tablet    Sig: Take 0.5-1 tablets (50-100 mg total) by mouth at bedtime as needed for sleep.    Dispense:  90 tablet    Refill:  1   FLUoxetine (PROZAC) 20 MG tablet    Sig: 1/2 tablet daily by mouth for 1 week then increase to 1 po qd    Dispense:  90 tablet    Refill:  1    Medications Discontinued During This Encounter  Medication Reason    furosemide (LASIX) 20 MG tablet Discontinued by provider     The patient was counseled, risk factors were discussed, anticipatory guidance given.  Pt was interviewed and evaluated by me in the clinic today for 32.5+ minutes, with over 50% time spent in face to face counseling of patients various medical conditions, treatment plans of those medical conditions including medicine management and lifestyle modification, strategies to improve health and well being; and in coordination of care. SEE ABOVE TREATMENT PLAN FOR DETAILS   Gross side effects, risk and benefits, and alternatives of medications discussed with patient.  Patient is aware that all medications have potential side effects and we are unable to predict every side effect or drug-drug interaction that may occur.  Expresses verbal understanding and consents to current therapy plan and treatment regimen.  Return for 6 wks f/up mood- started prozac and trazadone.  Please see AVS handed out to patient at the end of our visit for further patient instructions/ counseling done pertaining to today's office visit.    Note:  This document was prepared using Dragon voice recognition software and may include unintentional dictation errors.     Subjective:    HPI: Makayla Matthews is a 53 y.o. female who [facility] today for follow up of CHRONIC CONDITIONS.    --> just left  vein earlier today -  Pt states that all testing is  N-  All US and dopplers etc- WNL's.  -  Swelling in legs:   R foot- not above ankle.  No pain, tingling etc.    Patient states that they even checked ultrasound from her right groin all the way down to her toes.   Walk 3 times per wk 35-5860min. Reaching goal of 10k steps- started 1 week ago.   Patient very tearful in the office today.  She recently lost her mother in late April 2020.  She had bad Alzheimer's disease.  Patient has had a difficult time since.  Not sleeping well,  she has no joy or pleasure in her life anymore.  She states that everything she does reminds her of her mother and makes her upset.  She states planting begun using the backyard was 1 of the things she used to do all the time with her mother so she created huge [employer] in her backyard and her memory.   Also, patient states she cannot travel anywhere go anywhere because it reminded her too much of her mother.  -Also patient stressed for her teenage son's wellbeing with the [employer] life matter movement in all.  This is causing significant amount of stress and patient.  She lies awake at night and has a difficult time falling asleep.  Wt loss- working on it.     HTN: -  Her blood pressure has been controlled at home.  Pt has been checking it regularly-she does it faithfully 3 times per week or so.  138/90, 117/ 82.  Usually on diastolic 75-80 and top highest 134.    - Patient reports good compliance with blood pressure medications  - Denies medication S-E   - Smoking Status noted   - She denies new onset of: chest pain, exercise intolerance, shortness of breath, dizziness, visual changes, headache, lower extremity swelling or claudication.    Last 3 blood pressure readings in our office are as follows: BP Readings from Last 3 Encounters:  02/21/19 117/83  01/23/19 127/83  01/15/19 (!) 142/88    Pulse Readings from Last 3 Encounters:  02/21/19 89  01/23/19 72  01/15/19 98    Filed Weights   01/15/19 1541  Weight: 275 lb 4.8 oz (124.9 kg)      Patient Care Team    Relationship Specialty Notifications Start End  Thomasene Lotpalski, Marguerite Jarboe, DO PCP - General Family Medicine  02/05/18   Arnette FeltsMoore, Janece, FNP  General Practice  08/09/18      Lab Results  Component Value Date   CREATININE 0.75 08/07/2018   BUN 11 08/07/2018   NA 139 08/07/2018   K 4.3 08/07/2018   CL 98 08/07/2018   CO2 23 08/07/2018    Lab Results  Component Value Date   CHOL 144 08/07/2018   CHOL 152 02/24/2017     Lab Results  Component Value Date   HDL 55 08/07/2018   HDL 57 02/24/2017    Lab Results  Component Value Date   LDLCALC 76 08/07/2018   LDLCALC 83 02/24/2017    Lab Results  Component Value Date   TRIG 63 08/07/2018   TRIG 62 02/24/2017    Lab Results  Component Value Date   CHOLHDL 2.6 08/07/2018    Lab Results  Component Value Date   LDLDIRECT 78 08/07/2018   ===================================================================   Patient Active Problem List   Diagnosis Date Noted   Morbid obesity (HCC) 08/07/2018    Priority: High   Venous insufficiency (chronic) (peripheral)- R >  etr L LExt 02/05/2018    Priority: Medium   Family history of type 2 diabetes mellitus in mother 01/15/2019    Priority: Low   Back pain 01/23/2019   Family history of Alzheimer's disease- mother passed age 50 01/15/2019   Psychophysiological insomnia 01/15/2019   Adjustment disorder with mixed anxiety and depressed mood 01/15/2019   Localized swelling of right foot 08/07/2018   Noncompliance with diet and medication regimen 08/07/2018    High risk for OSA-  declines referral 08/07/2018   Elevated blood pressure reading 02/05/2018   Prediabetes 02/05/2018   Family history of thyroid disease 02/05/2018   Environmental and seasonal allergies 02/05/2018   Vitamin D deficiency 02/05/2018     History reviewed. No pertinent past medical history.   Past Surgical History:  Procedure Laterality Date   CESAREAN SECTION  2004     Family History  Problem Relation Age of Onset   Diabetes Mother    Hypertension Mother    Cancer Father      Social History   Substance and Sexual Activity  Drug Use Never  ,  Social History   Substance and Sexual Activity  Alcohol Use Yes   Alcohol/week: 1.0 standard drinks   Types: 1 Standard drinks or equivalent per week  ,  Social History   Tobacco Use  Smoking Status Never Smoker  Smokeless Tobacco Never Used   ,    Current Outpatient Medications on File Prior to Visit  Medication Sig Dispense Refill   Cholecalciferol (VITAMIN D3) 50000 units CAPS Take 1 capsule by mouth daily.     loratadine (ALLERGY RELIEF) 10 MG tablet Take 10 mg by mouth daily.     Multiple Vitamin (MULTIVITAMIN WITH MINERALS) TABS tablet Take 1 tablet by mouth daily.     No current facility-administered medications on file prior to visit.      Allergies  Allergen Reactions   Codeine Itching     Review of Systems:   General:  Denies fever, chills Optho/Auditory:   Denies visual changes, blurred vision Respiratory:   Denies SOB, cough, wheeze, DIB  Cardiovascular:   Denies chest pain, palpitations, painful respirations Gastrointestinal:   Denies nausea, vomiting, diarrhea.  Endocrine:     Denies new hot or cold intolerance Musculoskeletal:  Denies joint swelling, gait issues, or new unexplained myalgias/ arthralgias Skin:  Denies rash, suspicious lesions  Neurological:    Denies dizziness, unexplained weakness, numbness  Psychiatric/Behavioral:   Denies mood changes  Objective:    Blood pressure (!) 142/88, pulse 98, temperature 98.7 F (37.1 C), height 5\' 7"  (1.702 m), weight 275 lb 4.8 oz (124.9 kg), last menstrual period 12/18/2018, SpO2 99 %.  Body mass index is 43.12 kg/m.  General: Well Developed, well nourished, and in no acute distress.  HEENT: Normocephalic, atraumatic, pupils equal round reactive to light, neck supple, No carotid bruits, no JVD Skin: Warm and dry, cap RF less 2 sec Cardiac: Regular rate and rhythm, S1, S2 WNL's, no murmurs rubs or gallops Respiratory: ECTA B/L, Not using accessory muscles, speaking in full sentences. NeuroM-Sk: Ambulates w/o assistance, moves ext * 4 w/o difficulty, sensation grossly intact.  Ext: scant edema b/l lower ext Psych: No HI/SI, judgement and insight good, Euthymic mood. Full Affect.

## 2019-01-15 NOTE — Patient Instructions (Signed)
 -  Also we need to transition your brain into thinking more positively.  These tasks below are some things I want you to do every day 1)  write 3 new things that you are grateful for every day for 21 days  2)  exercise daily- walk for 15 minutes twice a day every day 3)  you are going to journal every day about one positive experience that you had 4)  meditate/pray every day.  You can go on YouTube and look for 15-minute relaxation meditation or what ever.  But we need to make sure that you are in the moment and relaxing and deep breathing every day 5)  Write 1 positive email every day to praise someone in your life     - If you have insomnia or difficulty sleeping, this information is for you:  - Avoid caffeinated beverages after lunch,  no alcoholic beverages,  no eating within 2-3 hours of lying down,  avoid exposure to blue light before bed,  avoid daytime naps, and  needs to maintain a regular sleep schedule- go to sleep and wake up around the same time every night.   - Resolve concerns or worries before entering bedroom:  Discussed relaxation techniques with patient and to keep a journal to write down fears\ worries.  I suggested seeing a counselor for CBT.   - Recommend patient meditate or do deep breathing exercises to help relax.   Incorporate the use of white noise machines or listen to "sleep meditation music", or recordings of guided meditations for sleep from YouTube which are free, such as  "guided meditation for detachment from over thinking"  by Mayford Knife.

## 2019-01-22 ENCOUNTER — Telehealth: Payer: Self-pay | Admitting: Family Medicine

## 2019-01-22 NOTE — Telephone Encounter (Signed)
Advised pt that Makayla Matthews will not RX any medications until she is seen on 01/23/2019.  However, advised pt that she may take 800mg  of ibuprofen or Aleve (verified that pt is not allergic to these medications), use a warm heating pad to the affected area and use warm epsom salt baths.  Pt expressed understanding and is agreeable.  Charyl Bigger, CMA

## 2019-01-22 NOTE — Telephone Encounter (Signed)
Patient called and has really bad back pain after working in her garden this past weekend, she wanted to be seen but Valetta Fuller wanted to avoid ortho type appts while our xray tech is out of the office. I have scheduled her tomorrow when Lenna Sciara is back in the office. Patient is wondering if there is anything medication wise we can do to help til tomorrow. Please advise patient is there is anything we can do.

## 2019-01-23 ENCOUNTER — Ambulatory Visit (INDEPENDENT_AMBULATORY_CARE_PROVIDER_SITE_OTHER): Payer: BC Managed Care – PPO | Admitting: Adult Health

## 2019-01-23 ENCOUNTER — Encounter: Payer: Self-pay | Admitting: Adult Health

## 2019-01-23 ENCOUNTER — Ambulatory Visit: Payer: BC Managed Care – PPO

## 2019-01-23 ENCOUNTER — Other Ambulatory Visit: Payer: Self-pay

## 2019-01-23 VITALS — BP 127/83 | HR 72 | Temp 98.4°F | Ht 67.0 in | Wt 275.0 lb

## 2019-01-23 DIAGNOSIS — M549 Dorsalgia, unspecified: Secondary | ICD-10-CM | POA: Insufficient documentation

## 2019-01-23 DIAGNOSIS — M545 Low back pain, unspecified: Secondary | ICD-10-CM

## 2019-01-23 MED ORDER — PREDNISONE 20 MG PO TABS: 20.0000 mg | ORAL_TABLET | Freq: Two times a day (BID) | ORAL | 0 refills | Status: DC

## 2019-01-23 MED ORDER — CYCLOBENZAPRINE HCL 10 MG PO TABS: 10.0000 mg | ORAL_TABLET | Freq: Three times a day (TID) | ORAL | 0 refills | Status: DC | PRN

## 2019-01-23 NOTE — Assessment & Plan Note (Addendum)
Xray- IMPRESSION: Mild degenerative disc disease is noted at L3-4. No acute abnormality seen in the lumbar spine. Follow Care Instructions Please take Prednisone as directed. Please take Cyclobenzaprine as needed- do not drive or use alcohol when taking. Rest! If your symptoms persist for another two weeks, call the clinic. Continue to social distance and wear a mask.

## 2019-01-23 NOTE — Patient Instructions (Addendum)
Lumbar Strain A lumbar strain, which is sometimes called a low-back strain, is a stretch or tear in a muscle or the strong cords of tissue that attach muscle to bone (tendons) in the lower back (lumbar spine). This type of injury occurs when muscles or tendons are torn or are stretched beyond their limits. Lumbar strains can range from mild to severe. Mild strains may involve stretching a muscle or tendon without tearing it. These may heal in 1-2 weeks. More severe strains involve tearing of muscle fibers or tendons. These will cause more pain and may take 6-8 weeks to heal. What are the causes? This condition may be caused by:  Trauma, such as a fall or a hit to the body.  Twisting or overstretching the back. This may result from doing activities that need a lot of energy, such as lifting heavy objects. What increases the risk? This injury is more common in:  Athletes.  People with obesity.  People who do repeated lifting, bending, or other movements that involve their back. What are the signs or symptoms? Symptoms of this condition may include:  Sharp or dull pain in the lower back that does not go away. The pain may extend to the buttocks.  Stiffness or limited range of motion.  Sudden muscle tightening (spasms). How is this diagnosed? This condition may be diagnosed based on:  Your symptoms.  Your medical history.  A physical exam.  Imaging tests, such as: ? X-rays. ? MRI. How is this treated? Treatment for this condition may include:  Rest.  Applying heat and cold to the affected area.  Over-the-counter medicines to help relieve pain and inflammation, such as NSAIDs.  Prescription pain medicine and muscle relaxants may be needed for a short time.  Physical therapy. Follow these instructions at home: Managing pain, stiffness, and swelling      If directed, put ice on the injured area during the first 24 hours after your injury. ? Put ice in a plastic bag.  ? Place a towel between your skin and the bag. ? Leave the ice on for 20 minutes, 2-3 times a day.  If directed, apply heat to the affected area as often as told by your health care provider. Use the heat source that your health care provider recommends, such as a moist heat pack or a heating pad. ? Place a towel between your skin and the heat source. ? Leave the heat on for 20-30 minutes. ? Remove the heat if your skin turns bright red. This is especially important if you are unable to feel pain, heat, or cold. You may have a greater risk of getting burned. Activity  Rest and return to your normal activities as told by your health care provider. Ask your health care provider what activities are safe for you.  Do exercises as told by your health care provider. Medicines  Take over-the-counter and prescription medicines only as told by your health care provider.  Ask your health care provider if the medicine prescribed to you: ? Requires you to avoid driving or using heavy machinery. ? Can cause constipation. You may need to take these actions to prevent or treat constipation:  Drink enough fluid to keep your urine pale yellow.  Take over-the-counter or prescription medicines.  Eat foods that are high in fiber, such as beans, whole grains, and fresh fruits and vegetables.  Limit foods that are high in fat and processed sugars, such as fried or sweet foods. Injury prevention To prevent  a future low-back injury:  Always warm up properly before physical activity or sports.  Cool down and stretch after being active.  Use correct form when playing sports and lifting heavy objects. Bend your knees before you lift heavy objects.  Use good posture when sitting and standing.  Stay physically fit and keep a healthy weight. ? Do at least 150 minutes of moderate-intensity exercise each week, such as brisk walking or water aerobics. ? Do strength exercises at least 2 times each week.   General instructions  Do not use any products that contain nicotine or tobacco, such as cigarettes, e-cigarettes, and chewing tobacco. If you need help quitting, ask your health care provider.  Keep all follow-up visits as told by your health care provider. This is important. Contact a health care provider if:  Your back pain does not improve after 6 weeks of treatment.  Your symptoms get worse. Get help right away if:  Your back pain is severe.  You are unable to stand or walk.  You develop pain in your legs.  You develop weakness in your buttocks or legs.  You have difficulty controlling when you urinate or when you have a bowel movement. ? You have frequent, painful, or bloody urination. ? You have a temperature over 101.68F (38.3C) Summary  A lumbar strain, which is sometimes called a low-back strain, is a stretch or tear in a muscle or the strong cords of tissue that attach muscle to bone (tendons) in the lower back (lumbar spine).  This type of injury occurs when muscles or tendons are torn or are stretched beyond their limits.  Rest and return to your normal activities as told by your health care provider. If directed, apply heat and ice to the affected area as often as told by your health care provider.  Take over-the-counter and prescription medicines only as told by your health care provider.  Contact a health care provider if you have new or worsening symptoms. This information is not intended to replace advice given to you by your health care provider. Make sure you discuss any questions you have with your health care provider. Document Released: 06/20/2005 Document Revised: 04/19/2018 Document Reviewed: 04/19/2018 Elsevier Patient Education  2020 Lead Instructions Please take Prednisone as directed. Please take Cyclobenzaprine as needed- do not drive or use alcohol when taking. Rest! If your symptoms persist for another two weeks,  call the clinic. Continue to social distance and wear a mask. FEEL BETTER!

## 2019-01-23 NOTE — Progress Notes (Addendum)
Subjective:    Patient ID: Makayla Matthews, female    DOB: Dec 19, 1965, 53 y.o.   MRN: [id number]  HPI:  Mr. Makayla Matthews presents with left sided lumbar back pain that developed 4 days ago (19 January 2019) when she attempted to pull a root from the ground. She believed that the root was cut free and she pulled hard to remove the root, however it was still attached. She felt immediate pain and it took a few minutes for her to straighten up. She now reports constant, pulsating "tight" pain that is rated 9/10. Pain is located over L Lumbar without radiation to LLE She denies bowel/bladder dysfunction She denies numbness/tingling in her lower extremities She has used alternating doses of OTC Acetaminophen or Ibuprofen- last doses were yesterday She denies previous injury  Patient Care Team    Relationship Specialty Notifications Start End  Thomasene Lotpalski, Deborah, DO PCP - General Family Medicine  02/05/18   Arnette FeltsMoore, Janece, FNP  General Practice  08/09/18     Patient Active Problem List   Diagnosis Date Noted  . Back pain 01/23/2019  . Family history of type 2 diabetes mellitus in mother 01/15/2019  . Family history of Alzheimer's disease- mother passed age 53 01/15/2019  . Psychophysiological insomnia 01/15/2019  . Adjustment disorder with mixed anxiety and depressed mood 01/15/2019  . Morbid obesity (HCC) 08/07/2018  . Localized swelling of right foot 08/07/2018  . Noncompliance with diet and medication regimen 08/07/2018  .  High risk for OSA-  declines referral 08/07/2018  . Elevated blood pressure reading 02/05/2018  . Prediabetes 02/05/2018  . Family history of thyroid disease 02/05/2018  . Environmental and seasonal allergies 02/05/2018  . Venous insufficiency (chronic) (peripheral)- R >etr L LExt 02/05/2018  . Vitamin D deficiency 02/05/2018     History reviewed. No pertinent past medical history.   Past Surgical History:  Procedure Laterality Date  . CESAREAN SECTION  2004      Family History  Problem Relation Age of Onset  . Diabetes Mother   . Hypertension Mother   . Cancer Father      Social History   Substance and Sexual Activity  Drug Use Never     Social History   Substance and Sexual Activity  Alcohol Use Yes  . Alcohol/week: 1.0 standard drinks  . Types: 1 Standard drinks or equivalent per week     Social History   Tobacco Use  Smoking Status Never Smoker  Smokeless Tobacco Never Used     Outpatient Encounter Medications as of 01/23/2019  Medication Sig  . Cholecalciferol (VITAMIN D3) 50000 units CAPS Take 1 capsule by mouth daily.  Marland Kitchen. FLUoxetine (PROZAC) 20 MG tablet 1/2 tablet daily by mouth for 1 week then increase to 1 po qd  . loratadine (ALLERGY RELIEF) 10 MG tablet Take 10 mg by mouth daily.  . Multiple Vitamin (MULTIVITAMIN WITH MINERALS) TABS tablet Take 1 tablet by mouth daily.  . traZODone (DESYREL) 100 MG tablet Take 0.5-1 tablets (50-100 mg total) by mouth at bedtime as needed for sleep.  . cyclobenzaprine (FLEXERIL) 10 MG tablet Take 1 tablet (10 mg total) by mouth 3 (three) times daily as needed for muscle spasms.  . predniSONE (DELTASONE) 20 MG tablet Take 1 tablet (20 mg total) by mouth 2 (two) times daily with a meal.   No facility-administered encounter medications on file as of 01/23/2019.     Allergies: Codeine  Body mass index is 43.07 kg/m.  Blood pressure 127/83,  pulse 72, temperature 98.4 F (36.9 C), temperature source Oral, height 5\' 7"  (1.702 m), weight 275 lb (124.7 kg), last menstrual period 12/02/2017, SpO2 99 %.  Review of Systems  Constitutional: Positive for fatigue. Negative for activity change, appetite change, chills, diaphoresis, fever and unexpected weight change.  Eyes: Negative for visual disturbance.  Respiratory: Negative for choking, chest tightness, shortness of breath, wheezing and stridor.   Cardiovascular: Negative for chest pain, palpitations and leg swelling.   Gastrointestinal: Negative for abdominal distention, anal bleeding, blood in stool, constipation, diarrhea and nausea.  Genitourinary: Negative for difficulty urinating, flank pain and hematuria.  Musculoskeletal: Positive for arthralgias, back pain, gait problem and myalgias. Negative for joint swelling, neck pain and neck stiffness.  Neurological: Negative for dizziness and headaches.  Hematological: Negative for adenopathy. Does not bruise/bleed easily.  Psychiatric/Behavioral: Positive for sleep disturbance.       Objective:   Physical Exam Vitals signs and nursing note reviewed.  Constitutional:      General: She is in acute distress.     Appearance: She is obese. She is not ill-appearing, toxic-appearing or diaphoretic.  HENT:     Head: Normocephalic and atraumatic.  Cardiovascular:     Rate and Rhythm: Normal rate and regular rhythm.     Pulses: Normal pulses.     Heart sounds: Normal heart sounds. No murmur. No friction rub. No gallop.   Pulmonary:     Effort: Pulmonary effort is normal. No respiratory distress.     Breath sounds: No stridor. No wheezing, rhonchi or rales.  Chest:     Chest wall: No tenderness.  Abdominal:     General: Abdomen is protuberant. Bowel sounds are normal. There is no distension.     Palpations: Abdomen is soft. There is no mass.  Musculoskeletal:        General: Tenderness present.     Cervical back: Normal.     Thoracic back: Normal.     Lumbar back: She exhibits tenderness and spasm. She exhibits normal range of motion and no bony tenderness.     Comments: Positive Straight Leg Raise- Left Leg  Skin:    General: Skin is warm and dry.     Capillary Refill: Capillary refill takes less than 2 seconds.  Neurological:     Mental Status: She is oriented to person, place, and time.  Psychiatric:        Mood and Affect: Mood normal.        Behavior: Behavior normal.        Thought Content: Thought content normal.        Judgment: Judgment  normal.       Assessment & Plan:   1. Acute left-sided low back pain without sciatica     Back pain Xray- IMPRESSION: Mild degenerative disc disease is noted at L3-4. No acute abnormality seen in the lumbar spine. Follow Care Instructions Please take Prednisone as directed. Please take Cyclobenzaprine as needed- do not drive or use alcohol when taking. Rest! If your symptoms persist for another two weeks, call the clinic. Continue to social distance and wear a mask.    FOLLOW-UP:  Return if symptoms worsen or fail to improve.

## 2019-02-21 ENCOUNTER — Encounter: Payer: Self-pay | Admitting: Family Medicine

## 2019-02-21 ENCOUNTER — Ambulatory Visit (INDEPENDENT_AMBULATORY_CARE_PROVIDER_SITE_OTHER): Payer: BC Managed Care – PPO | Admitting: Family Medicine

## 2019-02-21 ENCOUNTER — Other Ambulatory Visit: Payer: Self-pay

## 2019-02-21 VITALS — BP 117/83 | HR 89 | Temp 98.3°F | Resp 16 | Ht 67.5 in | Wt 268.0 lb

## 2019-02-21 DIAGNOSIS — F5104 Psychophysiologic insomnia: Secondary | ICD-10-CM | POA: Diagnosis not present

## 2019-02-21 DIAGNOSIS — F4323 Adjustment disorder with mixed anxiety and depressed mood: Secondary | ICD-10-CM

## 2019-02-21 DIAGNOSIS — R2241 Localized swelling, mass and lump, right lower limb: Secondary | ICD-10-CM | POA: Diagnosis not present

## 2019-02-21 NOTE — Patient Instructions (Signed)
  What is Chronic Stress Syndrome, Symptoms & Ways to Deal With it   What is Chronic Stress Syndrome?  Chronic Stress Syndrome is something which can now be called as a medical condition due to the amount of stress an individual is going through these days. Chronic Stress Syndrome causes the body and mind to shutdown and the person has no control over himself or herself. Due to the demands of modern day life and the hardship throughout day and night takes its toll over a period of time and the body and brain starts demanding rest and a break. This leads to certain symptoms where your performance level starts to dip at work, you become irritable both at work and at home, you may stop enjoying activities you previously liked, you may become depressed, you may get angry for even small things. Chronic Stress Syndrome can significantly impact your quality life. Thus it is important understand the symptoms of Chronic Stress Syndrome and react accordingly in order to cope up with it.  It is important to note here that a balanced work-home equation should be drawn to cut down symptoms of Chronic Stress Syndrome. Minor stressors can be overcome by the body's inbuilt stress response but when there is unending stress for a long period of time then an external help is required to ease the stress.  Chronic Stress Syndrome can physically and psychologically drain you over a period of time. For such cases stress management is the best way to cope up with Chronic Stress Syndrome. If Chronic Stress Syndrome is not treated then it may result in many health hazards like anxiety, muscle pain, insomnia, and high blood pressure along with a compromised immune system leading to frequent infections and missed days from work.    What are the Symptoms of Chronic Stress Syndrome?   The symptoms of Chronic Stress Syndrome are variable and range from generalized symptoms to emotional symptoms along with behavioral and  cognitive symptoms. Some of these symptoms have been delineated below:  Generalized Symptoms of Chronic Stress Syndrome are: Anxiety Depression Social isolation Headache Abdominal pain Lack of sleep Back pain Difficulty in concentrating Hypertension Hemorrhoids Varicose veins Panic attacks/ Panic disorder Cardiovascular diseases.   Some of the Emotional Symptoms of Chronic Stress Syndrome are: To become easily agitated, moody and frustrated Feeling overwhelmed which makes you feel like you are losing control. Having difficulty relaxing and have a peaceful mind Having low self esteem Feeling lonely Feeling worthless Feeling depressed Avoiding social environment.   Some of the Physical Symptoms of Chronic Stress Syndrome are: Headaches Lethargy Alternating diarrhea and constipation Nausea Muscles aches and pains Insomnia Rapid heartbeat and chest pain Infections and frequent colds Decreased libido Nervousness and shaking Tinnitus Sweaty palms Dry mouth Clenched jaw.  Some of the Cognitive Symptoms of Chronic Stress Syndrome are: Constant worrying Racing thoughts Disorganization and forgetfulness Inability to focus Poor judgment Abundance of negativity.  Some of the Behavioral Symptoms of Chronic Stress Syndrome are: Changes in appetite with less desire to eat Avoiding responsibilities Indulgence in alcohol or recreational drug use Increased nail biting and being fidgety Ways to Deal With Chronic Stress Syndrome    Chronic Stress Syndrome is not something which cannot be addressed. A bit of effort from your side in the form of lifestyle modifications, a little bit of exercise, a balanced work life equation can do wonders and help you get rid of Chronic Stress Syndrome.  Get Proper Sleep: It has been proved that Chronic   Stress Syndrome causes loss of sleep where an individual may not even be able to sleep for days unending. This may result in the  individual feeling lethargic and unable to focus at work the following morning. This may lead to decreased performance at work. Thus, it is important to have a good sleep-wake cycle. For this, try and not drink any caffeinated beverage about four hours prior to going to sleep, as caffeine pumps up the adrenaline and causes you to stay awake resulting ultimately in Chronic Stress Syndrome.  Avoid Alcohol and Drugs: Another way to get rid of Chronic Stress Syndrome is lifestyle modifications. Stay away from alcohol and other recreational drugs. Take Short Frequent Breaks at Work: Try to take frequent breaks from work and do not work continuously. Try and manage your work in such a way that you even meet your deadline and come home on time for a happy dinner with family. A good time spent with family and kids does wonders in not only dealing with Chronic Stress Syndrome but also preventing it.  Become Physically Active: Another step towards getting rid of Chronic Stress Syndrome is physical activity. If you do not have time to spend at the gym then at least try and go for daily walks for about half an hour a day which not only keeps the stress away but also is good for your overall health. Physical activity leads to production of endorphins which will make you feel relaxed and feel good.  Healthy Diet Can Help You Deal With Chronic Stress Syndrome: Have a balanced and healthy diet is another step towards a stress free life and keeping Chronic Stress Syndrome at bay. If time is a constraint then you can try eating three small meals a day. Try and avoid fast foods and take foods which are healthy and rich in proteins, fiber, and carbohydrates to boost your energy system.  Music Can Soothe Your Mind: Light music is one of the best and most effective relaxation techniques that one can try to overcome stress. It has shown to calm down the mind and take you away from all the stressors that you may be having. These  days it is also being used as a therapy in some institutes for overcoming stress. It is important here to discuss the importance of a good social support system for patients with Chronic Stress Syndrome, as a good social support framework can do wonders in taking the stress away from the patient and overcoming Chronic Stress Syndrome.  Meditation Can Help You Deal With Chronic Stress Syndrome Effectively: Meditation and yoga has also shown to be quite effective in relaxing the mind and coping up with Chronic Stress Syndrome   In cases where these measures are not helpful, then it is time for you to consult with a skilled psychologist or a psychiatrist for potential therapies or medications to control the stress response.   The psychologist can help you with a variety of steps for coping up with Chronic Stress Syndrome. Relaxation techniques and behavioral therapy are some of the methods employed by psychologists. In some cases, medications can also be given to help relax the patient.  Since Chronic Stress Syndrome is both emotionally and physically draining for the patient and it also adversely affects the family life of the patient hence it is important for the patient to recognize the condition and taking steps to cope up with it. Escaping measures like alcohol and drug use are of no help as they only   aggravate the condition apart from their other health hazards. If this condition is ignored or left untreated it can lead to various medical conditions like anxiety and depression and various other medical conditions.  Last but not least, smile as often as you can as it is the best gift that you can give to someone. The best way to stay relaxed is to have a good smile, exercise daily, spend time with your family, meditation and if required consultation with a good psychologist so that you can live a stress free life and overcome the symptoms of Chronic Stress Syndrome. 

## 2019-02-21 NOTE — Progress Notes (Signed)
Impression and Recommendations:    1. Adjustment disorder with mixed anxiety and depressed mood   2. Psychophysiological insomnia   3. Localized swelling of right foot     Mood: - Stable on current treatment with Prozac. - Continue treatment plan as established.  See med list. - Patient tolerating meds well without S-E. -Explained to patient likelihood to stay on treatment for 6-12 months.  Discussed high incidence of recurrence.  Discussed in the future we can wean her off at her desire under my direction if she so desires. - Reviewed the "spokes of the wheel" of mood and health management.  Stressed the importance of ongoing prudent habits, including regular exercise, appropriate sleep hygiene, healthful dietary habits, and prayer/meditation to relax.  - Encouraged patient to continue her coping habits of exercise and journalling.  - Discussed that we may discuss discontinuing medication in the future as patient's situational stress resolves and diminishes.  - Will continue to monitor.  Insomnia: - Stable at this time on Trazodone management (100 mg tablet). - Continue Trazodone as prescribed.  See med list. - Patient tolerating meds well without S-E.  - Will continue to monitor.  Elevated BP: - Stable at this time & well-controlled at home. - Reviewed goal BP with patient in office today.  - Ongoing prudent lifestyle changes such as dash diet and engaging in a regular exercise program discussed with patient.  Educational handouts provided  - Ongoing ambulatory BP monitoring encouraged. Keep log and bring in next OV.  - Will continue to monitor.  Foot Edema f/up - Managed by Specialty - Managed by specialist.  - Per patient, notices a difference when she exercises and drinks plenty of water. - Encouraged patient to continue to exercise regularly and hydrate. - Additionally encouraged patient to continue with weight loss goals.  - Encouraged patient to continue to  obtain follow-up imaging as recommended. - Treatment plan will continue to be managed and adjusted by specialist.  BMI Counseling - Body mass index is 41.36 kg/m Explained to patient what BMI refers to, and what it means medically.    Told patient to think about it as a "medical risk stratification measurement" and how increasing BMI is associated with increasing risk/ or worsening state of various diseases such as hypertension, hyperlipidemia, diabetes, premature OA, depression etc.  American Heart Association guidelines for healthy diet, basically Mediterranean diet, and exercise guidelines of 30 minutes 5 days per week or more discussed in detail.  Health counseling performed.  All questions answered.  Lifestyle & Preventative Health Maintenance - Advised patient to continue working toward exercising to improve overall mental, physical, and emotional health.    - Encouraged patient to engage in daily physical activity, especially a formal exercise routine.  Recommended that the patient eventually strive for at least 150 minutes of moderate cardiovascular activity per week according to guidelines established by the [facility].   - Healthy dietary habits encouraged, including low-carb, and high amounts of lean protein in diet.   - Patient should also consume adequate amounts of water.   Education and routine counseling performed. Handouts provided.  The patient was counseled, risk factors were discussed, anticipatory guidance given.  Gross side effects, risk and benefits, and alternatives of medications discussed with patient.  Patient is aware that all medications have potential side effects and we are unable to predict every side effect or drug-drug interaction that may occur.  Expresses verbal understanding and consents to current therapy plan and treatment  regimen.  Return for 4-53mo mood/sleep/needs a1c or sooner if concerns arise.  Please see AVS handed out to patient at the end of our  visit for further patient instructions/ counseling done pertaining to today's office visit.    Note:  This document was prepared using Dragon voice recognition software and may include unintentional dictation errors.  This document serves as a record of services personally performed by Mellody Dance, DO. It was created on her behalf by Toni Amend, a trained medical scribe. The creation of this record is based on the scribe's personal observations and the provider's statements to them.   I have reviewed the above medical documentation for accuracy and completeness and I concur.  Mellody Dance, DO 02/21/2019 5:06 PM        Subjective:    HPI: Makayla Matthews is a 53 y.o. female who presents to [facility] [facility] today for follow up of [place].    Since last visit, states she's doing well.  Since her mother's passing, she is getting along well with her brother and other family members.  Confirms family is doing okay and she started her job back today.  Says her job is going well, but getting used to social distancing with children; watching how they play and making sure they aren't hugging.  Says they are staggering work so it was "just half a day with just half of the children."  Feels everything went "pretty smooth" this morning.  Only her children and staff are allowed in her building.  Mood Management & Sleep Started Prozac and Trazodone last visit.  Says after only a week of taking the Prozac, she got up and fixed breakfast, bought fruit, was excited, and "hadn't felt that joyful in a minute."  Notes "I haven't done that in a long time."  Now takes a whole pill of Prozac.  Emphatically states she feels that this dose is good and working well.  As far as feeling sad, says "I may have had one or two days, but other than that, everything's good."  Has been tracking her sleeping with her FitBit.  Says she normally gets her 8 hours in, and was  only getting maybe 3 hours before trazodone.  Takes a full pill nightly.  Started with half and increased to full dose.  Is not feeling hung over in the morning.  Patient has begun journalling.  Notes she gets up with her dog, a chiweenie, and says "we've gained weight over COVID so I walk him."  She walks at least two miles every weekday afternoon, but not on the weekends.  She walks mainly by herself, for time to herself.  Also goes to sit out on her deck, which she wasn't doing before.  Notes some of the pleasure has returned to her life.  Additionally states she has lost weight.  Says "I think I stopped emotionally eating."  Notes at home she's lost about 10 lbs total, down to 265 lbs.  "I don't walk around just thinking about eating anymore."  She has also started drinking more water.  Foot Edema - Evaluated by Specialty Denies pain, it's just "swelling when it gets tight in my sandals."   Says that specialist stated she needs "another ultrasound while standing up."  Says that they checked her veins and everything is "flowing correctly."  When she does more walking, confirms it helps improve her edema and symptoms.  HTN:  -  Her blood pressure has  been controlled at home.  Pt has been checking it regularly.  BP in June  128/87 123/72 128/86 130/88 117/80 116/78 138/91 (highest) 107/73 (lowest)  - Smoking Status noted   - She denies new onset of: chest pain, exercise intolerance, shortness of breath, dizziness, visual changes, headache, lower extremity swelling or claudication.   Last 3 blood pressure readings in our office are as follows: BP Readings from Last 3 Encounters:  02/21/19 117/83  01/23/19 127/83  01/15/19 (!) 142/88    Pulse Readings from Last 3 Encounters:  02/21/19 89  01/23/19 72  01/15/19 98    Filed Weights   02/21/19 1554  Weight: 268 lb (121.6 kg)     GAD 7 : Generalized Anxiety Score 01/15/2019  Nervous, Anxious, on Edge 2  Control/stop  worrying 3  Worry too much - different things 3  Trouble relaxing 2  Restless 0  Easily annoyed or irritable 3  Afraid - awful might happen 3  Total GAD 7 Score 16   Dep[facility] screen [facility] 02/21/2019 01/23/2019 01/15/2019  Decreased Interest 1 0 0  Down, Depressed, Hopeless 0 0 0  PHQ - 2 Score 1 0 0  Altered sleeping 0 0 0  Tired, decreased energy 0 0 0  Change in appetite 0 1 0  Feeling bad or failure about yourself  1 0 0  Trouble concentrating 0 0 0  Moving slowly or fidgety/restless 0 0 0  Suicidal thoughts 0 0 0  PHQ-9 Score 2 1 0  Difficult doing work/chores Not difficult at all Not difficult at all Not difficult at all    Patient Care Team    Relationship Specialty Notifications Start End  Thomasene Lotpalski, Letica Giaimo, DO PCP - General Family Medicine  02/05/18   Arnette FeltsMoore, Janece, FNP  General Practice  08/09/18      Lab Results  Component Value Date   CREATININE 0.75 08/07/2018   BUN 11 08/07/2018   NA 139 08/07/2018   K 4.3 08/07/2018   CL 98 08/07/2018   CO2 23 08/07/2018    Lab Results  Component Value Date   CHOL 144 08/07/2018   CHOL 152 02/24/2017    Lab Results  Component Value Date   HDL 55 08/07/2018   HDL 57 02/24/2017    Lab Results  Component Value Date   LDLCALC 76 08/07/2018   LDLCALC 83 02/24/2017    Lab Results  Component Value Date   TRIG 63 08/07/2018   TRIG 62 02/24/2017    Lab Results  Component Value Date   CHOLHDL 2.6 08/07/2018    Lab Results  Component Value Date   LDLDIRECT 78 08/07/2018   ===================================================================   Patient Active Problem List   Diagnosis Date Noted  . Morbid obesity (HCC) 08/07/2018    Priority: High  . Venous insufficiency (chronic) (peripheral)- R >etr L LExt 02/05/2018    Priority: Medium  . Family history of type 2 diabetes mellitus in mother 01/15/2019    Priority: Low  . Back pain 01/23/2019  . Family history of Alzheimer's disease- mother passed age  53 01/15/2019  . Psychophysiological insomnia 01/15/2019  . Adjustment disorder with mixed anxiety and depressed mood 01/15/2019  . Localized swelling of right foot 08/07/2018  . Noncompliance with diet and medication regimen 08/07/2018  .  High risk for OSA-  declines referral 08/07/2018  . Elevated blood pressure reading 02/05/2018  . Prediabetes 02/05/2018  . Family history of thyroid disease 02/05/2018  . Environmental and seasonal  allergies 02/05/2018  . Vitamin D deficiency 02/05/2018     History reviewed. No pertinent past medical history.   Past Surgical History:  Procedure Laterality Date  . CESAREAN SECTION  2004     Family History  Problem Relation Age of Onset  . Diabetes Mother   . Hypertension Mother   . Cancer Father      Social History   Substance and Sexual Activity  Drug Use Never  ,  Social History   Substance and Sexual Activity  Alcohol Use Yes  . Alcohol/week: 1.0 standard drinks  . Types: 1 Standard drinks or equivalent per week  ,  Social History   Tobacco Use  Smoking Status Never Smoker  Smokeless Tobacco Never Used  ,    Current Outpatient Medications on File Prior to Visit  Medication Sig Dispense Refill  . Cholecalciferol (VITAMIN D3) 50000 units CAPS Take 1 capsule by mouth daily.    . cyclobenzaprine (FLEXERIL) 10 MG tablet Take 1 tablet (10 mg total) by mouth 3 (three) times daily as needed for muscle spasms. 30 tablet 0  . FLUoxetine (PROZAC) 20 MG tablet 1/2 tablet daily by mouth for 1 week then increase to 1 po qd 90 tablet 1  . loratadine (ALLERGY RELIEF) 10 MG tablet Take 10 mg by mouth daily.    . Multiple Vitamin (MULTIVITAMIN WITH MINERALS) TABS tablet Take 1 tablet by mouth daily.    . predniSONE (DELTASONE) 20 MG tablet Take 1 tablet (20 mg total) by mouth 2 (two) times daily with a meal. 10 tablet 0  . traZODone (DESYREL) 100 MG tablet Take 0.5-1 tablets (50-100 mg total) by mouth at bedtime as needed for sleep. 90  tablet 1   No current facility-administered medications on file prior to visit.      Allergies  Allergen Reactions  . Codeine Itching     Review of Systems:   General:  Denies fever, chills Optho/Auditory:   Denies visual changes, blurred vision Respiratory:   Denies SOB, cough, wheeze, DIB  Cardiovascular:   Denies chest pain, palpitations, painful respirations Gastrointestinal:   Denies nausea, vomiting, diarrhea.  Endocrine:     Denies new hot or cold intolerance Musculoskeletal:  Denies joint swelling, gait issues, or new unexplained myalgias/ arthralgias Skin:  Denies rash, suspicious lesions  Neurological:    Denies dizziness, unexplained weakness, numbness  Psychiatric/Behavioral:   Denies mood changes  Objective:    Blood pressure 117/83, pulse 89, temperature 98.3 F (36.8 C), resp. rate 16, height 5' 7.5" (1.715 m), weight 268 lb (121.6 kg), SpO2 99 %.  Body mass index is 41.36 kg/m.  General: Well Developed, well nourished, and in no acute distress.  HEENT: Normocephalic, atraumatic, pupils equal round reactive to light, neck supple, No carotid bruits, no JVD Skin: Warm and dry, cap RF less 2 sec Cardiac: Regular rate and rhythm, S1, S2 WNL's, no murmurs rubs or gallops Respiratory: ECTA B/L, Not using accessory muscles, speaking in full sentences. NeuroM-Sk: Ambulates w/o assistance, moves ext * 4 w/o difficulty, sensation grossly intact.  Ext: scant edema b/l lower ext Psych: No HI/SI, judgement and insight good, Euthymic mood. Full Affect.

## 2019-04-16 ENCOUNTER — Other Ambulatory Visit: Payer: Self-pay

## 2019-04-16 DIAGNOSIS — F4323 Adjustment disorder with mixed anxiety and depressed mood: Secondary | ICD-10-CM

## 2019-04-16 DIAGNOSIS — F5104 Psychophysiologic insomnia: Secondary | ICD-10-CM

## 2019-04-16 MED ORDER — FLUOXETINE HCL 20 MG PO TABS: 20.0000 mg | ORAL_TABLET | Freq: Every day | ORAL | 0 refills | Status: DC

## 2019-04-16 MED ORDER — TRAZODONE HCL 100 MG PO TABS: 50.0000 mg | ORAL_TABLET | Freq: Every evening | ORAL | 0 refills | Status: DC | PRN

## 2019-05-15 ENCOUNTER — Other Ambulatory Visit: Payer: Self-pay

## 2019-05-15 ENCOUNTER — Ambulatory Visit (INDEPENDENT_AMBULATORY_CARE_PROVIDER_SITE_OTHER): Payer: BC Managed Care – PPO | Admitting: Bariatrics

## 2019-05-15 ENCOUNTER — Encounter: Payer: Self-pay | Admitting: Bariatrics

## 2019-05-15 ENCOUNTER — Encounter (INDEPENDENT_AMBULATORY_CARE_PROVIDER_SITE_OTHER): Payer: Self-pay | Admitting: Bariatrics

## 2019-05-15 VITALS — BP 116/75 | HR 97 | Temp 99.7°F | Ht 67.0 in | Wt 246.0 lb

## 2019-05-15 DIAGNOSIS — R0602 Shortness of breath: Secondary | ICD-10-CM

## 2019-05-15 DIAGNOSIS — E559 Vitamin D deficiency, unspecified: Secondary | ICD-10-CM

## 2019-05-15 DIAGNOSIS — Z9189 Other specified personal risk factors, not elsewhere classified: Secondary | ICD-10-CM

## 2019-05-15 DIAGNOSIS — R7303 Prediabetes: Secondary | ICD-10-CM

## 2019-05-15 DIAGNOSIS — Z6838 Body mass index (BMI) 38.0-38.9, adult: Secondary | ICD-10-CM

## 2019-05-15 DIAGNOSIS — Z0289 Encounter for other administrative examinations: Secondary | ICD-10-CM

## 2019-05-15 DIAGNOSIS — F3289 Other specified depressive episodes: Secondary | ICD-10-CM | POA: Diagnosis not present

## 2019-05-15 DIAGNOSIS — R5383 Other fatigue: Secondary | ICD-10-CM

## 2019-05-16 ENCOUNTER — Encounter (INDEPENDENT_AMBULATORY_CARE_PROVIDER_SITE_OTHER): Payer: Self-pay | Admitting: Bariatrics

## 2019-05-16 LAB — COMPREHENSIVE METABOLIC PANEL
ALT: 19 IU/L (ref 0–32)
AST: 17 IU/L (ref 0–40)
Albumin/Globulin Ratio: 1.7 (ref 1.2–2.2)
Albumin: 4.3 g/dL (ref 3.8–4.9)
Alkaline Phosphatase: 104 IU/L (ref 39–117)
BUN/Creatinine Ratio: 9 (ref 9–23)
BUN: 7 mg/dL (ref 6–24)
Bilirubin Total: 0.5 mg/dL (ref 0.0–1.2)
CO2: 27 mmol/L (ref 20–29)
Calcium: 9.5 mg/dL (ref 8.7–10.2)
Chloride: 99 mmol/L (ref 96–106)
Creatinine, Ser: 0.76 mg/dL (ref 0.57–1.00)
GFR calc Af Amer: 104 mL/min/{1.73_m2} (ref >59)
GFR calc non Af Amer: 90 mL/min/{1.73_m2} (ref >59)
Globulin, Total: 2.6 g/dL (ref 1.5–4.5)
Glucose: 92 mg/dL (ref 65–99)
Potassium: 4.5 mmol/L (ref 3.5–5.2)
Sodium: 138 mmol/L (ref 134–144)
Total Protein: 6.9 g/dL (ref 6.0–8.5)

## 2019-05-16 LAB — HEMOGLOBIN A1C
Est. average glucose Bld gHb Est-mCnc: 123 mg/dL
Hgb A1c MFr Bld: 5.9 % — ABNORMAL HIGH (ref 4.8–5.6)

## 2019-05-16 LAB — LIPID PANEL WITH LDL/HDL RATIO
Cholesterol, Total: 148 mg/dL (ref 100–199)
HDL: 59 mg/dL (ref >39)
LDL Chol Calc (NIH): 78 mg/dL (ref 0–99)
LDL/HDL Ratio: 1.3 ratio (ref 0.0–3.2)
Triglycerides: 54 mg/dL (ref 0–149)
VLDL Cholesterol Cal: 11 mg/dL (ref 5–40)

## 2019-05-16 LAB — VITAMIN D 25 HYDROXY (VIT D DEFICIENCY, FRACTURES): Vit D, 25-Hydroxy: 29.7 ng/mL — ABNORMAL LOW (ref 30.0–100.0)

## 2019-05-16 LAB — INSULIN, RANDOM: INSULIN: 21.3 u[IU]/mL (ref 2.6–24.9)

## 2019-05-16 NOTE — Progress Notes (Signed)
Office: [phone]  /  Fax: [phone]   Dear Dr. Sharee Matthews,   Thank you for referring Makayla Matthews to our clinic. The following note includes my evaluation and treatment recommendations.  HPI:   Chief Complaint: OBESITY    Makayla Matthews has been referred by Makayla Lot, DO for consultation regarding her obesity and obesity related comorbidities.    Makayla Matthews (MR# [id number]) is a 53 y.o. female who presents on 05/15/2019 for obesity evaluation and treatment. Current BMI is Body mass index is 38.53 kg/m. Makayla Matthews has been struggling with her weight for many years and has been unsuccessful in either losing weight, maintaining weight loss, or reaching her healthy weight goal.     Makayla Matthews does not like to cook. She sometimes craves donuts and cake, especially around bedtime. She sometimes skips meals. She has lost 30 lbs since July 2020.      Makayla Matthews states she is currently in the action stage of change and ready to dedicate time achieving and maintaining a healthier weight. Makayla Matthews is interested in becoming our patient and working on intensive lifestyle modifications including (but not limited to) diet, exercise and weight loss.    Makayla Matthews states her family eats meals together she thinks her family will eat healthier with  her her desired weight loss is 21-46 lbs she has been heavy most of  her life she started gaining weight in college her heaviest weight ever was 310 lbs she has significant food cravings issues  she skips meals frequently she is frequently drinking liquids with calories she frequently makes poor food choices she frequently eats larger portions than normal  she struggles with emotional eating    Makayla Matthews feels her energy is lower than it should be. This has worsened with weight gain and has not worsened recently. Makayla Matthews admits to daytime somnolence and  denies waking up still tired. Patient is at risk for obstructive sleep apnea. Makayla Matthews has a  history of symptoms of daytime Makayla. Patient generally gets 8 hours of sleep per night, and states they generally have generally restful sleep. Snoring is present. Apneic episodes are not present. Epworth Sleepiness Score is 3.  Dyspnea on exertion Makayla Matthews notes increasing shortness of breath with exercising and seems to be worsening over time with weight gain. She notes getting out of breath sooner with activity than she used to. This has not gotten worse recently. Makayla Matthews denies orthopnea.  Pre-Diabetes Makayla Matthews has a diagnosis of pre-diabetes based on her elevated Hgb A1c and was informed this puts her at greater risk of developing diabetes. Last A1c was 6.2. She is not on any medications currently and continues to work on diet and exercise to decrease risk of diabetes.  At risk for diabetes Makayla Matthews is at higher than average risk for developing diabetes due to her obesity and pre-diabetes. She currently denies polyuria or polydipsia.  Vitamin D Deficiency Makayla Matthews has a diagnosis of vitamin D deficiency. She states she took high dose Vit D, but now taking OTC Vit D. She denies nausea, vomiting or muscle weakness.  Depression Screen Makayla Matthews's Food and Mood (modified PHQ-9) score was  Depression screen PHQ 2/9 05/15/2019  Decreased Interest 1  Down, Depressed, Hopeless 0  PHQ - 2 Score 1  Altered sleeping 0  Tired, decreased energy 1  Change in appetite 0  Feeling bad or failure about yourself  0  Trouble concentrating 0  Moving slowly or fidgety/restless 0  Suicidal thoughts 0  PHQ-9 Score 2  Difficult doing work/chores Not difficult at all    ASSESSMENT AND PLAN:  Other Makayla - Plan: EKG 12-Lead  Shortness of breath on exertion - Plan: Lipid Panel With LDL/HDL Ratio  Vitamin D deficiency - Plan: Vitamin D (25 hydroxy)  Prediabetes - Plan: HgB A1c, Insulin, random, Comprehensive Metabolic Panel (CMET)  Other depression - with emotional eating  At risk for diabetes  mellitus  Class 2 severe obesity with serious comorbidity and body mass index (BMI) of 38.0 to 38.9 in adult, unspecified obesity type (HCC)  PLAN:  Makayla Matthews was informed that her Makayla may be related to obesity, depression or many other causes. Labs will be ordered, and in the meanwhile [name] has agreed to work on diet, exercise and weight loss to help with Makayla. Proper sleep hygiene was discussed including the need for 7-8 hours of quality sleep each night. A sleep study was not ordered based on symptoms and Epworth score.  Dyspnea on exertion Makayla Matthews's shortness of breath appears to be obesity related and exercise induced. She has agreed to work on weight loss and gradually increase exercise to treat her exercise induced shortness of breath. If [name] follows our instructions and loses weight without improvement of her shortness of breath, we will plan to refer to pulmonology. We will monitor this condition regularly. Makayla Matthews agrees to this plan.  Pre-Diabetes Makayla Matthews will continue to work on weight loss, exercise, increasing protein, and decreasing simple carbohydrates in her diet to help decrease the risk of diabetes. We dicussed metformin including benefits and risks. She was informed that eating too many simple carbohydrates or too many calories at one sitting increases the likelihood of GI side effects. We will check labs today. Makayla Matthews agrees to follow up with [place] as directed to monitor her progress.  Diabetes risk counseling [name] was given extended (15 minutes) diabetes prevention counseling today. She is 53 y.o. female and has risk factors for diabetes including obesity and pre-diabetes. We discussed intensive lifestyle modifications today with an emphasis on weight loss as well as increasing exercise and decreasing simple carbohydrates in her diet.  Vitamin D Deficiency Makayla Matthews was informed that low vitamin D levels contributes to Makayla and are associated with  obesity, breast, and colon cancer. Makayla Matthews agrees to continue taking OTC Vit D 2,000 IU and will follow up for routine testing of vitamin D, at least 2-3 times per year. She was informed of the risk of over-replacement of vitamin D and agrees to not increase her dose unless she discusses this with [place] first. We will check labs today. Makayla Matthews agrees to follow up with our clinic in 2 to 3 weeks.  Depression Screen Makayla Matthews had a negative depression screening. Depression is commonly associated with obesity and often results in emotional eating behaviors. We will monitor this closely and work on CBT to help improve the non-hunger eating patterns. Referral to Psychology may be required if no improvement is seen as she continues in our clinic.  Obesity Makayla Matthews is currently in the action stage of change and her goal is to continue with weight loss efforts. I recommend Makayla Matthews begin the structured treatment plan as follows:  She has agreed to follow the Category 3 plan + 100 calories Janelle has been instructed to eventually work up to a goal of 150 minutes of combined cardio and strengthening exercise per week for weight loss and overall health benefits. We discussed the following Behavioral Modification Strategies today: increasing lean protein intake, decreasing simple carbohydrates , increasing vegetables,  decrease eating out and work on meal planning and easy cooking plans, increase H20 intake, no skipping meals, and keeping healthy foods in the home   She was informed of the importance of frequent follow up visits to maximize her success with intensive lifestyle modifications for her multiple health conditions. She was informed we would discuss her lab results at her next visit unless there is a critical issue that needs to be addressed sooner. Rodneshia agreed to keep her next visit at the agreed upon time to discuss these results.  ALLERGIES: Allergies  Allergen Reactions  . Codeine Itching     MEDICATIONS: Current Outpatient Medications on File Prior to Visit  Medication Sig Dispense Refill  . cyclobenzaprine (FLEXERIL) 10 MG tablet Take 1 tablet (10 mg total) by mouth 3 (three) times daily as needed for muscle spasms. 30 tablet 0  . loratadine (ALLERGY RELIEF) 10 MG tablet Take 10 mg by mouth daily.    . Multiple Vitamin (MULTIVITAMIN WITH MINERALS) TABS tablet Take 1 tablet by mouth daily.    . traZODone (DESYREL) 100 MG tablet Take 0.5-1 tablets (50-100 mg total) by mouth at bedtime as needed for sleep. 90 tablet 0  . Cholecalciferol (VITAMIN D3) 50000 units CAPS Take 1 capsule by mouth daily.    Marland Kitchen. FLUoxetine (PROZAC) 20 MG tablet Take 1 tablet (20 mg total) by mouth daily. 90 tablet 0  . predniSONE (DELTASONE) 20 MG tablet Take 1 tablet (20 mg total) by mouth 2 (two) times daily with a meal. 10 tablet 0   No current facility-administered medications on file prior to visit.     PAST MEDICAL HISTORY: Past Medical History:  Diagnosis Date  . Anxiety   . Back pain   . Depression   . Pre-diabetes   . Vitamin D deficiency     PAST SURGICAL HISTORY: Past Surgical History:  Procedure Laterality Date  . CESAREAN SECTION  2004    SOCIAL HISTORY: Social History   Tobacco Use  . Smoking status: Never Smoker  . Smokeless tobacco: Never Used  Substance Use Topics  . Alcohol use: Yes    Alcohol/week: 1.0 standard drinks    Types: 1 Standard drinks or equivalent per week  . Drug use: Never    FAMILY HISTORY: Family History  Problem Relation Age of Onset  . Diabetes Mother   . Hypertension Mother   . Thyroid disease Mother   . Cancer Father     ROS: Review of Systems  Constitutional: Positive for malaise/Makayla. Negative for weight loss.       + Trouble sleeping  HENT: Positive for sinus pain.   Eyes:       + Vision changes + Wear glasses or contacts  Respiratory: Positive for shortness of breath (with exertion).   Cardiovascular: Negative for orthopnea.   Gastrointestinal: Negative for nausea and vomiting.  Genitourinary: Negative for frequency.  Musculoskeletal:       Negative muscle weakness  Endo/Heme/Allergies: Negative for polydipsia.  Psychiatric/Behavioral: Positive for depression. Negative for suicidal ideas.       + Stress    PHYSICAL EXAM: Blood pressure 116/75, pulse 97, temperature 99.7 F (37.6 C), height 5\' 7"  (1.702 m), weight 246 lb (111.6 kg), SpO2 97 %. Body mass index is 38.53 kg/m. Physical Exam Vitals signs reviewed.  Constitutional:      Appearance: Normal appearance. She is obese.  HENT:     Head: Normocephalic and atraumatic.     Nose: Nose normal.  Eyes:  General: No scleral icterus.    Extraocular Movements: Extraocular movements intact.  Neck:     Musculoskeletal: Normal range of motion and neck supple.     Comments: No thyromegaly present Cardiovascular:     Rate and Rhythm: Regular rhythm. Tachycardia present.     Pulses: Normal pulses.     Heart sounds: Normal heart sounds.  Pulmonary:     Effort: Pulmonary effort is normal. No respiratory distress.     Breath sounds: Normal breath sounds.  Abdominal:     Palpations: Abdomen is soft.     Tenderness: There is no abdominal tenderness.     Comments: + Obesity  Musculoskeletal: Normal range of motion.     Right lower leg: No edema.     Left lower leg: No edema.  Skin:    General: Skin is warm and dry.  Neurological:     Mental Status: She is alert and oriented to person, place, and time.     Coordination: Coordination normal.  Psychiatric:        Mood and Affect: Mood normal.        Behavior: Behavior normal.     RECENT LABS AND TESTS: BMET    Component Value Date/Time   NA 138 05/15/2019 1125   K 4.5 05/15/2019 1125   CL 99 05/15/2019 1125   CO2 27 05/15/2019 1125   GLUCOSE 92 05/15/2019 1125   BUN 7 05/15/2019 1125   CREATININE 0.76 05/15/2019 1125   CALCIUM 9.5 05/15/2019 1125   GFRNONAA 90 05/15/2019 1125   GFRAA 104  05/15/2019 1125   Lab Results  Component Value Date   HGBA1C 5.9 (H) 05/15/2019   Lab Results  Component Value Date   INSULIN WILL FOLLOW 05/15/2019   CBC    Component Value Date/Time   WBC 8.3 08/07/2018 1352   RBC 4.55 08/07/2018 1352   HGB 12.8 08/07/2018 1352   HCT 38.3 08/07/2018 1352   PLT 448 08/07/2018 1352   MCV 84 08/07/2018 1352   MCH 28.1 08/07/2018 1352   MCHC 33.4 08/07/2018 1352   RDW 14.0 08/07/2018 1352   LYMPHSABS 2.8 08/07/2018 1352   EOSABS 0.4 08/07/2018 1352   BASOSABS 0.1 08/07/2018 1352   Iron/TIBC/Ferritin/ %Sat No results found for: IRON, TIBC, FERRITIN, IRONPCTSAT Lipid Panel     Component Value Date/Time   CHOL 148 05/15/2019 1125   TRIG 54 05/15/2019 1125   HDL 59 05/15/2019 1125   CHOLHDL 2.6 08/07/2018 1352   LDLCALC 78 05/15/2019 1125   LDLDIRECT 78 08/07/2018 1352   Hepatic Function Panel     Component Value Date/Time   PROT 6.9 05/15/2019 1125   ALBUMIN 4.3 05/15/2019 1125   AST 17 05/15/2019 1125   ALT 19 05/15/2019 1125   ALKPHOS 104 05/15/2019 1125   BILITOT 0.5 05/15/2019 1125      Component Value Date/Time   TSH 1.770 08/07/2018 1352   TSH 1.42 03/12/2018   TSH 1.42 02/24/2017    ECG  shows NSR with a rate of 104 BPM INDIRECT CALORIMETER done today shows a VO2 of 312 and a REE of 2170.  Her calculated basal metabolic rate is 0272 thus her basal metabolic rate is better than expected.       OBESITY BEHAVIORAL INTERVENTION VISIT  Today's visit was # 1   Starting weight: 246 lbs Starting date: 05/15/2019 Today's weight : 246 lbs Today's date: 05/15/2019 Total lbs lost to date: 0    ASK: We discussed the  diagnosis of obesity with Hulan Fess Oneil today and Finleigh agreed to give [place] permission to discuss obesity behavioral modification therapy today.  ASSESS: Kamera has the diagnosis of obesity and her BMI today is 38.52 Shammara is in the action stage of change   ADVISE: Parisha was educated on the  multiple health risks of obesity as well as the benefit of weight loss to improve her health. She was advised of the need for long term treatment and the importance of lifestyle modifications to improve her current health and to decrease her risk of future health problems.  AGREE: Multiple dietary modification options and treatment options were discussed and  Azelyn agreed to follow the recommendations documented in the above note.  ARRANGE: Laneah was educated on the importance of frequent visits to treat obesity as outlined per CMS and USPSTF guidelines and agreed to schedule her next follow up appointment today.  Trude Mcburney, am acting as transcriptionist for [employer], DO   I have reviewed the above documentation for accuracy and completeness, and I agree with the above. -Corinna Capra, DO

## 2019-05-20 ENCOUNTER — Encounter (INDEPENDENT_AMBULATORY_CARE_PROVIDER_SITE_OTHER): Payer: Self-pay | Admitting: Bariatrics

## 2019-05-28 ENCOUNTER — Ambulatory Visit (INDEPENDENT_AMBULATORY_CARE_PROVIDER_SITE_OTHER): Payer: BC Managed Care – PPO | Admitting: Bariatrics

## 2019-05-28 ENCOUNTER — Other Ambulatory Visit: Payer: Self-pay

## 2019-05-28 ENCOUNTER — Encounter (INDEPENDENT_AMBULATORY_CARE_PROVIDER_SITE_OTHER): Payer: Self-pay | Admitting: Bariatrics

## 2019-05-28 VITALS — BP 111/74 | HR 81 | Temp 98.1°F | Ht 67.0 in | Wt 243.0 lb

## 2019-05-28 DIAGNOSIS — E8881 Metabolic syndrome: Secondary | ICD-10-CM | POA: Diagnosis not present

## 2019-05-28 DIAGNOSIS — Z9189 Other specified personal risk factors, not elsewhere classified: Secondary | ICD-10-CM | POA: Diagnosis not present

## 2019-05-28 DIAGNOSIS — E559 Vitamin D deficiency, unspecified: Secondary | ICD-10-CM

## 2019-05-28 DIAGNOSIS — E6609 Other obesity due to excess calories: Secondary | ICD-10-CM

## 2019-05-28 DIAGNOSIS — Z6838 Body mass index (BMI) 38.0-38.9, adult: Secondary | ICD-10-CM

## 2019-05-28 MED ORDER — VITAMIN D3 1.25 MG (50000 UT) PO CAPS: 1.0000 | ORAL_CAPSULE | ORAL | 0 refills | Status: DC

## 2019-05-28 NOTE — Progress Notes (Signed)
Office: [phone]  /  Fax: [phone]   HPI:   Chief Complaint: OBESITY Makayla Matthews is here to discuss her progress with her obesity treatment plan. She is on the Category 3 plan + 100 calories and is following her eating plan approximately 90% of the time. She states she is walking 10,000 steps 7 times per week. Makayla Matthews is down 3 lbs. She needs some recipes for variety. She reports struggling with her water intake. Her weight is 243 lb (110.2 kg) today and has had a weight loss of 3 pounds over a period of 2 weeks since her last visit. She has lost 3 lbs since starting treatment with us.  Vitamin D deficiency Makayla Matthews has a diagnosis of Vitamin D deficiency. Last Vitamin D 29.7 on 05/15/2019. She is currently taking prescription Vit D and denies nausea, vomiting or muscle weakness.  Insulin Resistance Makayla Matthews has a diagnosis of insulin resistance based on her elevated fasting insulin level >5. Last A1c 5.9 on 05/15/2019 with an insulin of 21.3. Although Makayla Matthews's blood glucose readings are still under good control, insulin resistance puts her at greater risk of metabolic syndrome and diabetes. She is on no medications currently and continues to work on diet and exercise to decrease risk of diabetes.  At risk for diabetes Makayla Matthews is at higher than average risk for developing diabetes due to her obesity. She currently denies polyuria or polydipsia.  ASSESSMENT AND PLAN:  Vitamin D deficiency - Plan: Cholecalciferol (VITAMIN D3) 1.25 MG (50000 UT) CAPS  Insulin resistance  At risk for diabetes mellitus  Class 2 severe obesity with serious comorbidity and body mass index (BMI) of 38.0 to 38.9 in adult, unspecified obesity type (HCC)  PLAN:  Vitamin D Deficiency Makayla Matthews was informed that low Vitamin D levels contributes to fatigue and are associated with obesity, breast, and colon cancer. She agrees to continue to take prescription Vit D @ 50,000 IU every week #4 with 0 refills and  will follow-up for routine testing of Vitamin D, at least 2-3 times per year. She was informed of the risk of over-replacement of Vitamin D and agrees to not increase her dose unless she discusses this with us first. Makayla Matthews agrees to follow-up with our clinic in 2-3 weeks.  Insulin Resistance Makayla Matthews will continue to work on weight loss, exercise, and decreasing simple carbohydrates in her diet to help decrease the risk of diabetes. We dicussed metformin including benefits and risks. She was informed that eating too many simple carbohydrates or too many calories at one sitting increases the likelihood of GI side effects. Makayla Matthews will increase protein and healthy fats and increase activity.  Diabetes risk counseling Makayla Matthews was given extended (15 minutes) diabetes prevention counseling today. She is 53 y.o. female and has risk factors for diabetes including obesity. We discussed intensive lifestyle modifications today with an emphasis on weight loss as well as increasing exercise and decreasing simple carbohydrates in her diet.  Obesity Makayla Matthews is currently in the action stage of change. As such, her goal is to continue with weight loss efforts. She has agreed to follow the Category 3 plan + 100 calories. Makayla Matthews will work on meal planning, intentional eating, and increasing her water intake (64 ounces). She was given handouts on Recipes and Protein Foods. Makayla Matthews has been instructed to work up to a goal of 150 minutes of combined cardio and strengthening exercise per week for weight loss and overall health benefits. We discussed the following Behavioral Modification Strategies today: increasing lean protein  intake, decreasing simple carbohydrates, increasing vegetables, increase H20 intake, decrease eating out, no skipping meals, work on meal planning and easy cooking plans, keeping healthy foods in the home, family/coworker sabotage, travel eating strategies, holiday eating strategies, celebration  eating strategies, and avoiding temptations.  Makayla Matthews has agreed to follow up with our clinic in 2 weeks. She was informed of the importance of frequent follow up visits to maximize her success with intensive lifestyle modifications for her multiple health conditions.  ALLERGIES: Allergies  Allergen Reactions  . Codeine Itching    MEDICATIONS: Current Outpatient Medications on File Prior to Visit  Medication Sig Dispense Refill  . cyclobenzaprine (FLEXERIL) 10 MG tablet Take 1 tablet (10 mg total) by mouth 3 (three) times daily as needed for muscle spasms. 30 tablet 0  . FLUoxetine (PROZAC) 20 MG tablet Take 1 tablet (20 mg total) by mouth daily. 90 tablet 0  . loratadine (ALLERGY RELIEF) 10 MG tablet Take 10 mg by mouth daily.    . Multiple Vitamin (MULTIVITAMIN WITH MINERALS) TABS tablet Take 1 tablet by mouth daily.    . predniSONE (DELTASONE) 20 MG tablet Take 1 tablet (20 mg total) by mouth 2 (two) times daily with a meal. 10 tablet 0  . traZODone (DESYREL) 100 MG tablet Take 0.5-1 tablets (50-100 mg total) by mouth at bedtime as needed for sleep. 90 tablet 0   No current facility-administered medications on file prior to visit.     PAST MEDICAL HISTORY: Past Medical History:  Diagnosis Date  . Anxiety   . Back pain   . Depression   . Pre-diabetes   . Vitamin D deficiency     PAST SURGICAL HISTORY: Past Surgical History:  Procedure Laterality Date  . CESAREAN SECTION  2004    SOCIAL HISTORY: Social History   Tobacco Use  . Smoking status: Never Smoker  . Smokeless tobacco: Never Used  Substance Use Topics  . Alcohol use: Yes    Alcohol/week: 1.0 standard drinks    Types: 1 Standard drinks or equivalent per week  . Drug use: Never    FAMILY HISTORY: Family History  Problem Relation Age of Onset  . Diabetes Mother   . Hypertension Mother   . Thyroid disease Mother   . Cancer Father    ROS: Review of Systems  Gastrointestinal: Negative for nausea and  vomiting.  Musculoskeletal:       Negative for muscle weakness.   PHYSICAL EXAM: Blood pressure 111/74, pulse 81, temperature 98.1 F (36.7 C), height 5\' 7"  (1.702 m), weight 243 lb (110.2 kg), SpO2 97 %. Body mass index is 38.06 kg/m. Physical Exam Vitals signs reviewed.  Constitutional:      Appearance: Normal appearance. She is obese.  Cardiovascular:     Rate and Rhythm: Normal rate.     Pulses: Normal pulses.  Pulmonary:     Effort: Pulmonary effort is normal.     Breath sounds: Normal breath sounds.  Musculoskeletal: Normal range of motion.  Skin:    General: Skin is warm and dry.  Neurological:     Mental Status: She is alert and oriented to person, place, and time.  Psychiatric:        Behavior: Behavior normal.   RECENT LABS AND TESTS: BMET    Component Value Date/Time   NA 138 05/15/2019 1125   K 4.5 05/15/2019 1125   CL 99 05/15/2019 1125   CO2 27 05/15/2019 1125   GLUCOSE 92 05/15/2019 1125   BUN 7  05/15/2019 1125   CREATININE 0.76 05/15/2019 1125   CALCIUM 9.5 05/15/2019 1125   GFRNONAA 90 05/15/2019 1125   GFRAA 104 05/15/2019 1125   Lab Results  Component Value Date   HGBA1C 5.9 (H) 05/15/2019   HGBA1C 6.2 (A) 01/15/2019   HGBA1C 6.2 (H) 08/07/2018   HGBA1C 6.0 03/12/2018   HGBA1C 6.0 02/24/2017   Lab Results  Component Value Date   INSULIN 21.3 05/15/2019   CBC    Component Value Date/Time   WBC 8.3 08/07/2018 1352   RBC 4.55 08/07/2018 1352   HGB 12.8 08/07/2018 1352   HCT 38.3 08/07/2018 1352   PLT 448 08/07/2018 1352   MCV 84 08/07/2018 1352   MCH 28.1 08/07/2018 1352   MCHC 33.4 08/07/2018 1352   RDW 14.0 08/07/2018 1352   LYMPHSABS 2.8 08/07/2018 1352   EOSABS 0.4 08/07/2018 1352   BASOSABS 0.1 08/07/2018 1352   Iron/TIBC/Ferritin/ %Sat No results found for: IRON, TIBC, FERRITIN, IRONPCTSAT Lipid Panel     Component Value Date/Time   CHOL 148 05/15/2019 1125   TRIG 54 05/15/2019 1125   HDL 59 05/15/2019 1125   CHOLHDL  2.6 08/07/2018 1352   LDLCALC 78 05/15/2019 1125   LDLDIRECT 78 08/07/2018 1352   Hepatic Function Panel     Component Value Date/Time   PROT 6.9 05/15/2019 1125   ALBUMIN 4.3 05/15/2019 1125   AST 17 05/15/2019 1125   ALT 19 05/15/2019 1125   ALKPHOS 104 05/15/2019 1125   BILITOT 0.5 05/15/2019 1125      Component Value Date/Time   TSH 1.770 08/07/2018 1352   TSH 1.42 03/12/2018   TSH 1.42 02/24/2017   Results for MAISYN, NOURI (MRN [id number]) as of 05/28/2019 14:41  Ref. Range 05/15/2019 11:25  Vitamin D, 25-Hydroxy Latest Ref Range: 30.0 - 100.0 ng/mL 29.7 (L)   OBESITY BEHAVIORAL INTERVENTION VISIT  Today's visit was #2  Starting weight: 246 lbs Starting date: 05/15/2019 Today's weight: 243 lbs  Today's date: 05/28/2019 Total lbs lost to date: 3     05/28/2019  Height 5\' 7"  (1.702 m)  Weight 243 lb (110.2 kg)  BMI (Calculated) 38.05  BLOOD PRESSURE - SYSTOLIC 111  BLOOD PRESSURE - DIASTOLIC 74   Body Fat % 49.1 %  Total Body Water (lbs) 86.6 lbs   ASK: We discussed the diagnosis of obesity with Javeah R Deboer today and Shandee agreed to give permission to discuss obesity behavioral modification therapy today.  ASSESS: Taleia has the diagnosis of obesity and her BMI today is 38.2. Layali is in the action stage of change.  ADVISE: Elynor was educated on the multiple health risks of obesity as well as the benefit of weight loss to improve her health. She was advised of the need for long term treatment and the importance of lifestyle modifications to improve her current health and to decrease her risk of future health problems.  AGREE: Multiple dietary modification options and treatment options were discussed and  Evalyse agreed to follow the recommendations documented in the above note.  ARRANGE: Makayla Matthews was educated on the importance of frequent visits to treat obesity as outlined per CMS and USPSTF guidelines and agreed to schedule her next follow  up appointment today.  Makayla Noss, am acting as Fernanda Drum for Energy manager, DO   I have reviewed the above documentation for accuracy and completeness, and I agree with the above. -[employer], DO

## 2019-06-20 ENCOUNTER — Encounter (INDEPENDENT_AMBULATORY_CARE_PROVIDER_SITE_OTHER): Payer: Self-pay

## 2019-06-20 ENCOUNTER — Ambulatory Visit (INDEPENDENT_AMBULATORY_CARE_PROVIDER_SITE_OTHER): Payer: BC Managed Care – PPO | Admitting: Bariatrics

## 2019-08-26 ENCOUNTER — Telehealth: Payer: Self-pay | Admitting: Family Medicine

## 2019-08-26 ENCOUNTER — Ambulatory Visit: Payer: BC Managed Care – PPO | Admitting: Family Medicine

## 2019-08-26 ENCOUNTER — Ambulatory Visit: Payer: BC Managed Care – PPO | Attending: Family

## 2019-08-26 DIAGNOSIS — Z23 Encounter for immunization: Secondary | ICD-10-CM | POA: Insufficient documentation

## 2019-08-26 NOTE — Progress Notes (Signed)
   Covid-19 Vaccination Clinic  Name:  Makayla Matthews    MRN: [id number] DOB: Oct 07, 1965  08/26/2019  [name] was observed post Covid-19 immunization for 15 minutes without incidence. She was provided with Vaccine Information Sheet and instruction to access the V-Safe system.   [name] was instructed to call 911 with any severe reactions post vaccine: Marland Kitchen Difficulty breathing  . Swelling of your face and throat  . A fast heartbeat  . A bad rash all over your body  . Dizziness and weakness    Immunizations Administered    Name Date Dose VIS Date Route   Moderna COVID-19 Vaccine 08/26/2019  3:29 PM 0.5 mL 06/04/2019 Intramuscular   Manufacturer: Moderna   Lot: [id number]   NDC: 20037-944-46

## 2019-08-26 NOTE — Telephone Encounter (Signed)
Joselyn Glassman her a 30 min appt--thanks gail

## 2019-09-09 ENCOUNTER — Other Ambulatory Visit: Payer: Self-pay

## 2019-09-09 ENCOUNTER — Ambulatory Visit: Payer: BC Managed Care – PPO | Admitting: Family Medicine

## 2019-10-08 ENCOUNTER — Other Ambulatory Visit: Payer: Self-pay

## 2019-10-08 ENCOUNTER — Ambulatory Visit: Payer: BC Managed Care – PPO | Attending: Family

## 2019-10-08 ENCOUNTER — Ambulatory Visit (INDEPENDENT_AMBULATORY_CARE_PROVIDER_SITE_OTHER): Payer: BC Managed Care – PPO | Admitting: Family Medicine

## 2019-10-08 ENCOUNTER — Encounter: Payer: Self-pay | Admitting: Family Medicine

## 2019-10-08 VITALS — BP 124/84 | HR 73 | Temp 98.1°F | Resp 12 | Ht 67.5 in | Wt 235.3 lb

## 2019-10-08 DIAGNOSIS — F4323 Adjustment disorder with mixed anxiety and depressed mood: Secondary | ICD-10-CM

## 2019-10-08 DIAGNOSIS — F5104 Psychophysiologic insomnia: Secondary | ICD-10-CM | POA: Diagnosis not present

## 2019-10-08 DIAGNOSIS — E559 Vitamin D deficiency, unspecified: Secondary | ICD-10-CM

## 2019-10-08 DIAGNOSIS — R7303 Prediabetes: Secondary | ICD-10-CM

## 2019-10-08 DIAGNOSIS — R030 Elevated blood-pressure reading, without diagnosis of hypertension: Secondary | ICD-10-CM

## 2019-10-08 DIAGNOSIS — Z23 Encounter for immunization: Secondary | ICD-10-CM

## 2019-10-08 DIAGNOSIS — L709 Acne, unspecified: Secondary | ICD-10-CM

## 2019-10-08 DIAGNOSIS — J3089 Other allergic rhinitis: Secondary | ICD-10-CM

## 2019-10-08 MED ORDER — FLUOXETINE HCL 40 MG PO CAPS: 40.0000 mg | ORAL_CAPSULE | Freq: Every day | ORAL | 1 refills | Status: DC

## 2019-10-08 MED ORDER — CETIRIZINE HCL 10 MG PO TABS: 10.0000 mg | ORAL_TABLET | Freq: Every day | ORAL | 1 refills | Status: DC

## 2019-10-08 MED ORDER — AZELASTINE HCL 0.1 % NA SOLN: NASAL | 1 refills | Status: DC

## 2019-10-08 MED ORDER — TRAZODONE HCL 100 MG PO TABS: 100.0000 mg | ORAL_TABLET | Freq: Every evening | ORAL | 1 refills | Status: DC | PRN

## 2019-10-08 NOTE — Patient Instructions (Addendum)
Burt's Bees blemish stick for spot control, along with benzoyl peroxide acne facial was twice daily.     Acne  Acne is a skin problem that causes pimples and other skin changes. The skin has many tiny openings called pores. Each pore contains an oil gland. Oil glands make an oily substance that is called sebum. Acne occurs when the pores in the skin get blocked. The pores may become infected with bacteria, or they may become red, sore, and swollen. Acne is a common skin problem, especially for teenagers. It often occurs on the face, neck, chest, upper arms, and back. Acne usually goes away over time. What are the causes? Acne is caused when oil glands get blocked with sebum, dead skin cells, and dirt. The bacteria that are normally found in the oil glands then multiply and cause inflammation. Acne is commonly triggered by changes in your hormones. These hormonal changes can cause the oil glands to get bigger and to make more sebum. Factors that can make acne worse include:  Hormone changes during: ? Adolescence. ? Women's menstrual cycles. ? Pregnancy.  Oil-based cosmetics and hair products.  Stress.  Hormone problems that are caused by certain diseases.  Certain medicines.  Pressure from headbands, backpacks, or shoulder pads.  Exposure to certain oils and chemicals.  Eating a diet high in carbohydrates that quickly turn to sugar. These include dairy products, desserts, and chocolates. What increases the risk? This condition is more likely to develop in:  Teenagers.  People who have a family history of acne. What are the signs or symptoms? Symptoms include:  Small, red bumps (pimples or papules).  Whiteheads.  Blackheads.  Small, pus-filled pimples (pustules).  Big, red pimples or pustules that feel tender. More severe acne can cause:  An abscess. This is an infected area that contains a collection of pus.  Cysts. These are hard, painful, fluid-filled  sacs.  Scars. These can happen after large pimples heal. How is this diagnosed? This condition is diagnosed with a medical history and physical exam. Blood tests may also be done. How is this treated? Treatment for this condition can vary depending on the severity of your acne. Treatment may include:  Creams and lotions that prevent oil glands from clogging.  Creams and lotions that treat or prevent infections and inflammation.  Antibiotic medicines that are applied to the skin or taken as a pill.  Pills that decrease sebum production.  Birth control pills.  Light or laser treatments.  Injections of medicine into the affected areas.  Chemicals that cause peeling of the skin.  Surgery. Your health care provider will also recommend the best way to take care of your skin. Good skin care is the most important part of treatment. Follow these instructions at home: Skin care Take care of your skin as told by your health care provider. You may be told to do these things:  Wash your skin gently at least two times each day, as well as: ? After you exercise. ? Before you go to bed.  Use mild soap.  Apply a water-based skin moisturizer after you wash your skin.  Use a sunscreen or sunblock with SPF 30 or greater. This is especially important if you are using acne medicines.  Choose cosmetics that will not block your oil glands (are noncomedogenic). Medicines  Take over-the-counter and prescription medicines only as told by your health care provider.  If you were prescribed an antibiotic medicine, apply it or take it as told by  your health care provider. Do not stop using the antibiotic even if your condition improves. General instructions  Keep your hair clean and off your face. If you have oily hair, shampoo your hair regularly or daily.  Avoid wearing tight headbands or hats.  Avoid picking or squeezing your pimples. That can make your acne worse and cause scarring.  Shave  gently and only when necessary.  Keep a food journal to figure out if any foods are linked to your acne. Avoid dairy products, desserts, and chocolates.  Take steps to manage and reduce stress.  Keep all follow-up visits as told by your health care provider. This is important. Contact a health care provider if:  Your acne is not better after eight weeks.  Your acne gets worse.  You have a large area of skin that is red or tender.  You think that you are having side effects from any acne medicine. Summary  Acne is a skin problem that causes pimples and other skin changes. Acne is a common skin problem, especially for teenagers. Acne usually goes away over time.  Acne is commonly triggered by changes in your hormones. There are many other causes, such as stress, diet, and certain medicines.  Follow your health care provider's instructions for how to take care of your skin. Good skin care is the most important part of treatment.  Take over-the-counter and prescription medicines only as told by your health care provider.  Contact your health care provider if you think that you are having side effects from any acne medicine. This information is not intended to replace advice given to you by your health care provider. Make sure you discuss any questions you have with your health care provider. Document Revised: 10/31/2017 Document Reviewed: 10/31/2017 Elsevier Patient Education  [place].       Benzoyl Peroxide skin cream, gel or lotion  What is this medicine? BENZOYL PEROXIDE (BEN zoe ill per OX ide) is used on the skin to treat mild to moderate acne. This medicine may be used for other purposes; ask your health care provider or pharmacist if you have questions. COMMON BRAND NAME(S): Acne Medication, Acne-10, Acneclear, Benprox, Benzac, Benzac AC, Benzac W, Benzagel, BenzaShave, BenzEFoam, BenzEFoam Ultra, BenzePrO, Benziq, Benziq LS, BP Cleansing Lotion, BP Gel, BP  Topical, BPO, Brevoxyl-4, Brevoxyl-8, Clearasil, Clearasil Ultra, Clearasil Vanishing, Clearplex, Clearplex X, Clearskin, Clinac BPO, Del Aqua, Del[place], Moffat, Dixon, Pilgrim's Pride, [place] Acne Wash Kit, Lavoclen-8 Acne Wash Kit, NeoBenz, [employer], [employer] Pack, [employer], OC8, O[place], PanOxyl, PanOxyl AQ, PanOxyl Aqua, RE Benzoyl Peroxide, Riax, Seba, Seba-Gel, Soluclenz Rx, Theroxide, Triaz, Zoderm What should I tell my health care provider before I take this medicine? They need to know if you have any of these conditions:  asthma  skin disease, abrasions, irritation or infection  sunburn  an unusual or allergic reaction to benzoic acid, cinnamon, parabens, sulfites, other medicines, foods, dyes, or preservatives  pregnant or trying to get pregnant  breast-feeding How should I use this medicine? This medicine is for external use only. Do not take by mouth. Follow the directions on the prescription label. Before using, wash affected area with a gentle cleanser and pat dry. Do not apply to raw or irritated skin. Apply enough medicine to cover the area and rub in gently. Avoid getting medicine in your eyes, lips, nose, mouth, or other sensitive areas. Do not wash treated areas of skin for at least 1 hour after using the medicine.  If you experience very dry and peeling skin or skin irritation, talk to your doctor or health care professional. Talk to your pediatrician or health care professional regarding the use of this medicine in children. Special care may be needed. Overdosage: If you think you have taken too much of this medicine contact a poison control center or emergency room at once. NOTE: This medicine is only for you. Do not share this medicine with others. What if I miss a dose? If you miss a dose, use it as soon as you can. If it is almost time for your next dose, use only that dose. Do not use double or extra doses. What may interact with this  medicine?  adapalene  isotretinoin  salicylic acid or sulfur containing products  topical antibiotics such as clindamycin or erythromycin  tretinoin This list may not describe all possible interactions. Give your health care provider a list of all the medicines, herbs, non-prescription drugs, or dietary supplements you use. Also tell them if you smoke, drink alcohol, or use illegal drugs. Some items may interact with your medicine. What should I watch for while using this medicine? Your acne may get worse during the first few weeks of treatment, and then start to get better. It may take 8 to 12 weeks before you see the full effect. If you do not see any improvement within 4 to 6 weeks, call your doctor or health care professional. Once you see a decrease in your acne, you may need to continue to use this medicine to control it. Do not use products that may dry the skin like medicated cosmetics, products that contain alcohol, or abrasive soaps or cleaners. Do not use other acne or skin treatment on the same area that you use this medicine unless your doctor or health care professional tells you to. If you use these together they can cause severe skin irritation. This medicine can make you more sensitive to the sun. Keep out of the sun. If you cannot avoid being in the sun, wear protective clothing and use sunscreen. Do not use sun lamps or tanning beds/booths. This medicine may bleach hair or colored fabrics. Avoid getting the medicine on your clothes. What side effects may I notice from receiving this medicine? Side effects that you should report to your doctor or health care professional as soon as possible:  allergic reactions like skin rash, itching or hives, swelling of the face, lips, or tongue  severe burning, itching, reddening, crusting, or swelling of the treated areas Side effects that usually do not require medical attention (report to your doctor or health care professional if  they continue or are bothersome):  increased sensitivity to the sun  mild burning or stinging of the treated areas  red, inflamed, and irritated skin This list may not describe all possible side effects. Call your doctor for medical advice about side effects. You may report side effects to FDA at 1-800-FDA-1088. Where should I keep my medicine? Keep out of the reach of children. Store at room temperature between 15 and 30 degrees C (59 and 86 degrees F). Throw away any unused medication after the expiration date. NOTE: This sheet is a summary. It may not cover all possible information. If you have questions about this medicine, talk to your doctor, pharmacist, or health care provider.  2020 Elsevier/Gold Standard (2007-09-19 16:11:05)

## 2019-10-08 NOTE — Progress Notes (Signed)
   Covid-19 Vaccination Clinic  Name:  Makayla Matthews    MRN: [id number] DOB: 1965-07-14  10/08/2019  [name] was observed post Covid-19 immunization for 15 minutes without incident. She was provided with Vaccine Information Sheet and instruction to access the V-Safe system.   [name] was instructed to call 911 with any severe reactions post vaccine: Marland Kitchen Difficulty breathing  . Swelling of face and throat  . A fast heartbeat  . A bad rash all over body  . Dizziness and weakness   Immunizations Administered    Name Date Dose VIS Date Route   Moderna COVID-19 Vaccine 10/08/2019 10:34 AM 0.5 mL 06/04/2019 Intramuscular   Manufacturer: Moderna   Lot: [id number]   NDC: 21624-469-50

## 2019-10-08 NOTE — Progress Notes (Signed)
Impression and Recommendations:    1. Adjustment disorder with mixed anxiety and depressed mood   2. Psychophysiological insomnia   3. Morbid obesity (HCC)   4. Prediabetes   5. Vitamin D insufficiency   6. Elevated blood pressure reading   7. Environmental and seasonal allergies   8. Acne, unspecified acne type     Adjustment disorder with mixed anxiety and depressed mood - Per patient, mood suboptimally controlled at this time. - Discussed modification of treatment plan today.  - Begin 40 mg capsule of Prozac.( inc from 20)  See med list. - Patient knows to monitor herself for side-effects while dosage is modified.  - In addition to prescription intervention, reviewed the "spokes of the wheel" of mood and health management.  Stressed the importance of ongoing prudent habits, including regular exercise, appropriate sleep hygiene, healthful dietary habits, and prayer/meditation to relax.  - Will continue to monitor.    Psychophysiological insomnia - Stable at this time. - Per patient, "my sleep is good."  - She continues one full tablet of trazodone nightly. - Continue management as established.  See med list.  - Will continue to monitor.   Environmental & Seasonal Allergies - After exposure to allergens, advised patient to take a shower, wash her face.  - Advised the patient to begin using AYR or Neilmed sinus rinses BID followed by nasal spray BID (one spray to each nostril), especially after exposure to allergens.  - Patient may continue Zyrtec OTC daily.   - Will continue to monitor.   Prediabetes - A1c 5.9 last check four months ago, down from 6.2 eight months ago.  - Counseled patient on prevention of disease and discussed dietary and lifestyle modification as first line.    - Importance of low carb, heart-healthy diet discussed with patient in addition to regular aerobic exercise of 5d/week or more.   - Will continue to monitor and re-check as  discussed.   Vitamin D insufficiency - 29.7 last check, down from 52.5 one year prior.  - Continue supplementation as advised, but needs to be consistent.  - Will continue to monitor and re-check as discussed.   Acne - Skin Breakouts due to Mask Wearing - Advised patient to wash her masks in [facility] for sensitive skin. - change materials of masks prn - Patient knows to make sure she is properly hydrated. - Encouraged patient to engage in prudent oral care.  - Told patient that she may use a benzoyl peroxide acne wash OTC. - In addition, advised use of blemish stick such as [employer] brand.  - Will continue to monitor.   Elevated blood pressure reading - white coat syndrome - Stable at this time.  - Blood pressure currently is stable, at goal. - Reviewed goal blood pressure extensively with patient today.  - Counseled patient on prevention of disease and discussed lifestyle modification as first line.   - Lifestyle changes such as dash and heart healthy diets and engaging in a regular exercise program discussed extensively with patient.   - Ambulatory blood pressure monitoring encouraged at least 3 times weekly.  Keep log and bring in every office visit.  Reminded patient that if they ever feel poorly in any way, to check their blood pressure and pulse.  - We will continue to monitor.   BMI Counseling - Body mass index is 36.31 kg/m Explained to patient what BMI refers to, and what it means medically.    Told patient  to think about it as a "medical risk stratification measurement" and how increasing BMI is associated with increasing risk/ or worsening state of various diseases such as hypertension, hyperlipidemia, diabetes, premature OA, depression etc.  American Heart Association guidelines for healthy diet, basically Mediterranean diet, and exercise guidelines of 30 minutes 5 days per week or more discussed in detail.  - To help reduce the swelling in her bilateral  lower extremities, encouraged patient to continue with prudent weight loss.  Health counseling performed.  All questions answered.   Health Counseling & Preventative Maintenance - Advised patient to continue working toward exercising to improve overall mental, physical, and emotional health.    - Encouraged patient to engage in daily physical activity as tolerated, especially a formal exercise routine.  Recommended that the patient eventually strive for at least 150 minutes of moderate cardiovascular activity per week according to guidelines established by the [facility].   - Healthy dietary habits encouraged, including low-carb, and high amounts of lean protein in diet.   - Patient should also consume adequate amounts of water.   Education and routine counseling performed. Handouts provided.   Medications Discontinued During This Encounter  Medication Reason  . loratadine (ALLERGY RELIEF) 10 MG tablet Error  . FLUoxetine (PROZAC) 20 MG tablet Dose change  . traZODone (DESYREL) 100 MG tablet   . cetirizine (ZYRTEC) 10 MG tablet Reorder     Meds ordered this encounter  Medications  . FLUoxetine (PROZAC) 40 MG capsule    Sig: Take 1 capsule (40 mg total) by mouth daily.    Dispense:  90 capsule    Refill:  1  . traZODone (DESYREL) 100 MG tablet    Sig: Take 1 tablet (100 mg total) by mouth at bedtime as needed for sleep.    Dispense:  90 tablet    Refill:  1  . azelastine (ASTELIN) 0.1 % nasal spray    Sig: 1 spray each nostril twice daily after sinus rinses    Dispense:  30 mL    Refill:  1  . cetirizine (ZYRTEC) 10 MG tablet    Sig: Take 1 tablet (10 mg total) by mouth daily.    Dispense:  90 tablet    Refill:  1    Gross side effects, risk and benefits, and alternatives of medications and treatment plan in general discussed with patient.  Patient is aware that all medications have potential side effects and we are unable to predict every side effect or drug-drug interaction  that may occur.   Patient will call with any questions prior to using medication if they have concerns.  Expresses verbal understanding and consents to current therapy and treatment regimen.  No barriers to understanding were identified.  Red flag symptoms and signs discussed in detail.  Patient expressed understanding regarding what to do in case of emergency\urgent symptoms  Please see AVS handed out to patient at the end of our visit for further patient instructions/ counseling done pertaining to today's office visit.   Return for yearly CPE next coupel of mo(this summer), and f/up every 6 mo for sleep, mood, BP-bring home log.     Note:  This document was prepared using Dragon voice recognition software and may include unintentional dictation errors.   The 21st Century Cures Act was signed into law in 2016 which includes the topic of electronic health records.  This provides immediate access to information in MyChart.  This includes consultation notes, operative notes, office notes, lab results and  pathology reports.  If you have any questions about what you read please let us know at your next visit or call us at the office.  We are right here with you.   I, Peggye Fothergill, am serving as Neurosurgeon for [employer].   This case required medical decision making of at least moderate complexity.  This document serves as a record of services personally performed by Thomasene Lot, DO. It was created on her behalf by Peggye Fothergill, a trained medical scribe. The creation of this record is based on the scribe's personal observations and the provider's statements to them.   The above documentation from Peggye Fothergill, medical scribe, has been reviewed by Carlye Grippe,  D.O.     --------------------------------------------------------------------------------------------------------------------------------------------------------------------------------------------------------------------------------------------    Subjective:    CC:  Chief Complaint  Patient presents with  . Depression  . Insomnia    HPI: KERRIA SAPIEN is a 54 y.o. female who presents to [facility] today for follow-up of mood.   Says "overall I'm pretty good."  Work has been fine.  She works at [employer] and notes her kids have been in school since August, and "we've had no COVID-19 cases that we know of."  She received her second dose of COVID-19 vaccination today.  - Breakouts due to mask Notes that due to wearing her mask, her skin is breaking out.  She uses her same Rubie Maid skin care routine.  - Weight Loss Goals She has lost 40 lbs since July of 2020.  She tries to eat 1600 calories per day, measuring out her food etc., and plans to continue with her weight loss goals.  - Mood Management She's noticed lately that she's been "getting kinda snappy," and "I don't know where that came from."  Says "before that, I was fine."  For about a month, she's been noticing that she gets a little snappy.  Denies more stress in her life.  Denies sleeping less.  Denies exercising less; "I've actually stepped it up."  Has not been able to identify anything in particular that might have caused this change; "nothing that I can think of."  She has been managed on 20 mg of Prozac for a long while.  Says she wasn't sure if she's felt snappier due to being more stressed because she's increased her exercise routine, etc.  - Insomnia Stable at this time on one full tablet of trazodone nightly.  - Seasonal Allergies Her allergies are bad right now.  She's been taking Zyrtec OTC.  HPI:  Hypertension:  -  Her blood pressure at home has been running: 117/82-83 on  average.  Notes she does continue to experience swelling in her lower extremities, but she has been evaluated by specialists but they have been unable to find anything.  She feels that the swelling has improved since she's lost weight.  - She denies new onset of: chest pain, exercise intolerance, shortness of breath, dizziness, visual changes, headache, new lower extremity swelling or claudication.   Last 3 blood pressure readings in our office are as follows: BP Readings from Last 3 Encounters:  10/08/19 124/84  05/28/19 111/74  05/15/19 116/75   Filed Weights   10/08/19 1531  Weight: 235 lb 4.8 oz (106.7 kg)      Depression screen [facility] 2/9 10/08/2019 05/15/2019 02/21/2019  Decreased Interest 1 1 1   Down, Depressed, Hopeless 1 0 0  PHQ - 2 Score 2 1 1   Altered sleeping 0 0  0  Tired, decreased energy 0 1 0  Change in appetite 0 0 0  Feeling bad or failure about yourself  1 0 1  Trouble concentrating 0 0 0  Moving slowly or fidgety/restless 0 0 0  Suicidal thoughts 0 0 0  PHQ-9 Score 3 2 2   Difficult doing work/chores Somewhat difficult Not difficult at all Not difficult at all     GAD 7 : Generalized Anxiety Score 10/08/2019 01/15/2019  Nervous, Anxious, on Edge 0 2  Control/stop worrying 1 3  Worry too much - different things 0 3  Trouble relaxing 0 2  Restless 0 0  Easily annoyed or irritable 2 3  Afraid - awful might happen 0 3  Total GAD 7 Score 3 16  Anxiety Difficulty Somewhat difficult -     Wt Readings from Last 3 Encounters:  10/08/19 235 lb 4.8 oz (106.7 kg)  05/28/19 243 lb (110.2 kg)  05/15/19 246 lb (111.6 kg)   BP Readings from Last 3 Encounters:  10/08/19 124/84  05/28/19 111/74  05/15/19 116/75   Pulse Readings from Last 3 Encounters:  10/08/19 73  05/28/19 81  05/15/19 97   BMI Readings from Last 3 Encounters:  10/08/19 36.31 kg/m  05/28/19 38.06 kg/m  05/15/19 38.53 kg/m         Patient Care Team    Relationship Specialty  Notifications Start End  Mellody Dance, DO PCP - General Family Medicine  02/05/18   Minette Brine, [place]  General Practice  08/09/18      Patient Active Problem List   Diagnosis Date Noted  . Morbid obesity ([place]) 08/07/2018  . Venous insufficiency (chronic) (peripheral)- R >etr L LExt 02/05/2018  . Family history of type 2 diabetes mellitus in mother 01/15/2019  . Class 2 obesity due to excess calories with body mass index (BMI) of 38.0 to 38.9 in adult 05/28/2019  . Back pain 01/23/2019  . Family history of Alzheimer's disease- mother passed age 16 01/15/2019  . Psychophysiological insomnia 01/15/2019  . Adjustment disorder with mixed anxiety and depressed mood 01/15/2019  . Localized swelling of right foot 08/07/2018  . Noncompliance with diet and medication regimen 08/07/2018  .  High risk for OSA-  declines referral 08/07/2018  . Elevated blood pressure reading 02/05/2018  . Prediabetes 02/05/2018  . Family history of thyroid disease 02/05/2018  . Environmental and seasonal allergies 02/05/2018  . Vitamin D insufficiency 02/05/2018    Past Medical history, Surgical history, Family history, Social history, Allergies and Medications have been entered into the medical record, reviewed and changed as needed.    Current Meds  Medication Sig  . cetirizine (ZYRTEC) 10 MG tablet Take 1 tablet (10 mg total) by mouth daily.  . Cholecalciferol (VITAMIN D3) 1.25 MG (50000 UT) CAPS Take 1 capsule by mouth once a week.  . cyclobenzaprine (FLEXERIL) 10 MG tablet Take 1 tablet (10 mg total) by mouth 3 (three) times daily as needed for muscle spasms.  . Multiple Vitamin (MULTIVITAMIN WITH MINERALS) TABS tablet Take 1 tablet by mouth daily.  . traZODone (DESYREL) 100 MG tablet Take 1 tablet (100 mg total) by mouth at bedtime as needed for sleep.  . [DISCONTINUED] cetirizine (ZYRTEC) 10 MG tablet Take 10 mg by mouth daily.  . [DISCONTINUED] FLUoxetine (PROZAC) 20 MG tablet Take 1 tablet (20 mg  total) by mouth daily.  . [DISCONTINUED] traZODone (DESYREL) 100 MG tablet Take 0.5-1 tablets (50-100 mg total) by mouth at bedtime as needed for  sleep.    Allergies:  Allergies  Allergen Reactions  . Codeine Itching     Review of Systems: Review of Systems: General:   No F/C, wt loss Pulm:   No DIB, SOB, pleuritic chest pain Card:  No CP, palpitations Abd:  No n/v/d or pain Ext:  No inc edema from baseline Psych: no SI/ HI    Objective:   Blood pressure 124/84, pulse 73, temperature 98.1 F (36.7 C), temperature source Oral, resp. rate 12, height 5' 7.5" (1.715 m), weight 235 lb 4.8 oz (106.7 kg), SpO2 99 %. Body mass index is 36.31 kg/m. General:  Well Developed, well nourished, appropriate for stated age.  Neuro:  Alert and oriented,  extra-ocular muscles intact  HEENT:  Normocephalic, atraumatic, neck supple, no carotid bruits appreciated  Skin:  no gross rash, warm, pink. Cardiac:  RRR, S1 S2 Respiratory:  ECTA B/L and A/P, Not using accessory muscles, speaking in full sentences- unlabored. Vascular:  Ext warm, no cyanosis apprec.; cap RF less 2 sec. Psych:  No HI/SI, judgement and insight good, Euthymic mood. Full Affect. BLE: No pitting edema in her bilateral LE- much improved from prior.

## 2019-12-10 ENCOUNTER — Other Ambulatory Visit: Payer: BC Managed Care – PPO

## 2019-12-16 ENCOUNTER — Encounter: Payer: BC Managed Care – PPO | Admitting: Physician Assistant

## 2020-01-15 ENCOUNTER — Other Ambulatory Visit: Payer: BC Managed Care – PPO

## 2020-01-21 ENCOUNTER — Encounter: Payer: BC Managed Care – PPO | Admitting: Physician Assistant

## 2020-04-08 ENCOUNTER — Ambulatory Visit (INDEPENDENT_AMBULATORY_CARE_PROVIDER_SITE_OTHER): Payer: BC Managed Care – PPO | Admitting: Physician Assistant

## 2020-04-08 ENCOUNTER — Other Ambulatory Visit: Payer: Self-pay

## 2020-04-08 ENCOUNTER — Encounter: Payer: Self-pay | Admitting: Physician Assistant

## 2020-04-08 VITALS — BP 118/76 | HR 93 | Ht 67.5 in | Wt 247.4 lb

## 2020-04-08 DIAGNOSIS — M25512 Pain in left shoulder: Secondary | ICD-10-CM

## 2020-04-08 DIAGNOSIS — F4323 Adjustment disorder with mixed anxiety and depressed mood: Secondary | ICD-10-CM

## 2020-04-08 DIAGNOSIS — F5104 Psychophysiologic insomnia: Secondary | ICD-10-CM

## 2020-04-08 DIAGNOSIS — Z6838 Body mass index (BMI) 38.0-38.9, adult: Secondary | ICD-10-CM

## 2020-04-08 DIAGNOSIS — R030 Elevated blood-pressure reading, without diagnosis of hypertension: Secondary | ICD-10-CM | POA: Diagnosis not present

## 2020-04-08 DIAGNOSIS — E6609 Other obesity due to excess calories: Secondary | ICD-10-CM

## 2020-04-08 MED ORDER — TRAZODONE HCL 100 MG PO TABS: 100.0000 mg | ORAL_TABLET | Freq: Every evening | ORAL | 1 refills | Status: DC | PRN

## 2020-04-08 MED ORDER — PHENTERMINE HCL 37.5 MG PO TABS: ORAL_TABLET | ORAL | 0 refills | Status: DC

## 2020-04-08 MED ORDER — FLUOXETINE HCL 40 MG PO CAPS: 40.0000 mg | ORAL_CAPSULE | Freq: Every day | ORAL | 1 refills | Status: DC

## 2020-04-08 NOTE — Progress Notes (Signed)
Established Patient Office Visit  Subjective:  Patient ID: Makayla Matthews, female    DOB: [birth year]  Age: 54 y.o. MRN: [id number]  CC:  Chief Complaint  Patient presents with  . Depression  . Insomnia    HPI Makayla Matthews presents for follow up on elevated BP and mood management.  HTN: Pt denies chest pain, palpitations, dizziness or lower extremity swelling. Taking medication as directed without side effects. Checks BP at home and readings range in 110s/70s. Reports on days she is more stressed her BP will be higher around 130s/80.   Mood: At last OV Prozac was increased and reports symptoms are better. Feels like Prozac 40 mg is working well. Denies SI/HI.  Insomnia: Reports Trazodone helps with sleep. She usually doesn't take it on the weekends.  L shoulder pain: States in the past she had to have a steroid injection due to shoulder pain which limited her movement. Reports she started experiencing similar symptoms after getting her Covid-19 vaccine, and feels like she needs another injection.  Weight: States she has been trying to loose weight with monitoring her diet and trying to stay active, but lately she has been struggling with losing weight. Reports she likes to snack at nighttime which is most likely contributing to gaining weight. States she would be interested on weight loss medication to help with her appetite.    Past Medical History:  Diagnosis Date  . Anxiety   . Back pain   . Depression   . Pre-diabetes   . Vitamin D deficiency     Past Surgical History:  Procedure Laterality Date  . CESAREAN SECTION  2004    Family History  Problem Relation Age of Onset  . Diabetes Mother   . Hypertension Mother   . Thyroid disease Mother   . Cancer Father     Social History   Socioeconomic History  . Marital status: Married    Spouse name: Gery Pray  . Number of children: Not on file  . Years of education: Not on file  . Highest education level: Not on file   Occupational History  . Occupation: Midwife  Tobacco Use  . Smoking status: Never Smoker  . Smokeless tobacco: Never Used  Vaping Use  . Vaping Use: Never used  Substance and Sexual Activity  . Alcohol use: Yes    Alcohol/week: 1.0 standard drink    Types: 1 Standard drinks or equivalent per week  . Drug use: Never  . Sexual activity: Yes    Birth control/protection: None  Other Topics Concern  . Not on file  Social History Narrative  . Not on file   Social Determinants of Health   Financial Resource Strain:   . Difficulty of Paying Living Expenses: Not on file  Food Insecurity:   . Worried About Programme researcher, broadcasting/film/video in the Last Year: Not on file  . Ran Out of Food in the Last Year: Not on file  Transportation Needs:   . Lack of Transportation (Medical): Not on file  . Lack of Transportation (Non-Medical): Not on file  Physical Activity:   . Days of Exercise per Week: Not on file  . Minutes of Exercise per Session: Not on file  Stress:   . Feeling of Stress : Not on file  Social Connections:   . Frequency of Communication with Friends and Family: Not on file  . Frequency of Social Gatherings with Friends and Family: Not on file  . Attends Religious  Services: Not on file  . Active Member of Clubs or Organizations: Not on file  . Attends BankerClub or Organization Meetings: Not on file  . Marital Status: Not on file  Intimate Partner Violence:   . Fear of Current or Ex-Partner: Not on file  . Emotionally Abused: Not on file  . Physically Abused: Not on file  . Sexually Abused: Not on file    Outpatient Medications Prior to Visit  Medication Sig Dispense Refill  . azelastine (ASTELIN) 0.1 % nasal spray 1 spray each nostril twice daily after sinus rinses 30 mL 1  . cetirizine (ZYRTEC) 10 MG tablet Take 1 tablet (10 mg total) by mouth daily. 90 tablet 1  . Cholecalciferol (VITAMIN D3) 1.25 MG (50000 UT) CAPS Take 1 capsule by mouth once a week. 4 capsule 0  .  Multiple Vitamin (MULTIVITAMIN WITH MINERALS) TABS tablet Take 1 tablet by mouth daily.    Marland Kitchen. FLUoxetine (PROZAC) 40 MG capsule Take 1 capsule (40 mg total) by mouth daily. 90 capsule 1  . traZODone (DESYREL) 100 MG tablet Take 1 tablet (100 mg total) by mouth at bedtime as needed for sleep. 90 tablet 1  . cyclobenzaprine (FLEXERIL) 10 MG tablet Take 1 tablet (10 mg total) by mouth 3 (three) times daily as needed for muscle spasms. (Patient not taking: Reported on 04/08/2020) 30 tablet 0  . predniSONE (DELTASONE) 20 MG tablet Take 1 tablet (20 mg total) by mouth 2 (two) times daily with a meal. (Patient not taking: Reported on 04/08/2020) 10 tablet 0   No facility-administered medications prior to visit.    Allergies  Allergen Reactions  . Codeine Itching    ROS Review of Systems  A fourteen system review of systems was performed and found to be positive as per HPI.  Objective:    Physical Exam General:  Well Developed, well nourished, appropriate for stated age.  Neuro:  Alert and oriented,  extra-ocular muscles intact  HEENT:  Normocephalic, atraumatic, neck supple, no carotid bruits appreciated  Skin:  no gross rash, warm, pink. Cardiac:  RRR, S1 S2 Respiratory:  ECTA B/L and A/P, Not using accessory muscles, speaking in full sentences- unlabored. Vascular:  Ext warm, no cyanosis apprec. No gross edema MSK: TTP of left shoulder near biceps tendon, Limited ROM with flexion, abduction and internal rotation, no ecchymosis or bony abnormality,   Psych:  No HI/SI, judgement and insight good, Euthymic mood. Full Affect.   BP 118/76   Pulse 93   Ht 5' 7.5" (1.715 m)   Wt 247 lb 6.4 oz (112.2 kg)   SpO2 98%   BMI 38.18 kg/m  Wt Readings from Last 3 Encounters:  04/08/20 247 lb 6.4 oz (112.2 kg)  10/08/19 235 lb 4.8 oz (106.7 kg)  05/28/19 243 lb (110.2 kg)     Health Maintenance Due  Topic Date Due  . Hepatitis C Screening  Never done  . HIV Screening  Never done  .  TETANUS/TDAP  Never done  . COLONOSCOPY  Never done  . MAMMOGRAM  08/31/2017  . PAP SMEAR-Modifier  01/25/2018  . INFLUENZA VACCINE  02/02/2020    There are no preventive care reminders to display for this patient.  Lab Results  Component Value Date   TSH 1.770 08/07/2018   Lab Results  Component Value Date   WBC 8.3 08/07/2018   HGB 12.8 08/07/2018   HCT 38.3 08/07/2018   MCV 84 08/07/2018   PLT 448 08/07/2018   Lab  Results  Component Value Date   NA 138 05/15/2019   K 4.5 05/15/2019   CO2 27 05/15/2019   GLUCOSE 92 05/15/2019   BUN 7 05/15/2019   CREATININE 0.76 05/15/2019   BILITOT 0.5 05/15/2019   ALKPHOS 104 05/15/2019   AST 17 05/15/2019   ALT 19 05/15/2019   PROT 6.9 05/15/2019   ALBUMIN 4.3 05/15/2019   CALCIUM 9.5 05/15/2019   Lab Results  Component Value Date   CHOL 148 05/15/2019   Lab Results  Component Value Date   HDL 59 05/15/2019   Lab Results  Component Value Date   LDLCALC 78 05/15/2019   Lab Results  Component Value Date   TRIG 54 05/15/2019   Lab Results  Component Value Date   CHOLHDL 2.6 08/07/2018   Lab Results  Component Value Date   HGBA1C 5.9 (H) 05/15/2019      Assessment & Plan:   Problem List Items Addressed This Visit      Other   Elevated blood pressure reading   Psychophysiological insomnia   Relevant Medications   traZODone (DESYREL) 100 MG tablet   Adjustment disorder with mixed anxiety and depressed mood - Primary   Relevant Medications   FLUoxetine (PROZAC) 40 MG capsule   Class 2 obesity due to excess calories with body mass index (BMI) of 38.0 to 38.9 in adult   Relevant Medications   phentermine (ADIPEX-P) 37.5 MG tablet    Other Visit Diagnoses    Left shoulder pain, unspecified chronicity       Relevant Orders   Ambulatory referral to Sports Medicine     Elevated BP reading: -BP stable -Continue ambulatory BP monitoring. -Follow low-sodium diet. -Will continue to monitor.  Adjustment  disorder with mixed anxiety and depressed mood: -Stable, PHQ-9 score of 2 -Continue current medication regimen. -Will continue to monitor.  Psychophysiological insomnia: -Stable, continue trazodone as needed -Encouraged to establish a good sleep hygiene. -Will continue to monitor.  Class II obesity due to excess calories with body mass index (BMI) of 38.0 to 38.9: -Discussed with patient management options including medications and side effects.  Patient would like to start phentermine. Discussed with patient phentermine use is short-term. Patient verbalized understanding. -Recommend to continue with dietary changes and increase physical activity. -Follow-up in 4 weeks to reassess weight and medication therapy.  Left shoulder pain: -Signs and symptoms suggest possible biceps tendinitis or impingement syndrome. -Placed referral to sports medicine for further evaluation and management.    Meds ordered this encounter  Medications  . phentermine (ADIPEX-P) 37.5 MG tablet    Sig: Take 0.5 tablet by mouth x 7 days. Then take 1 tablet by mouth.    Dispense:  30 tablet    Refill:  0    Order Specific Question:   Supervising Provider    Answer:   Nani Gasser D [[id number]]  . FLUoxetine (PROZAC) 40 MG capsule    Sig: Take 1 capsule (40 mg total) by mouth daily.    Dispense:  90 capsule    Refill:  1    Order Specific Question:   Supervising Provider    Answer:   Nani Gasser D [[id number]]  . traZODone (DESYREL) 100 MG tablet    Sig: Take 1 tablet (100 mg total) by mouth at bedtime as needed for sleep.    Dispense:  90 tablet    Refill:  1    Order Specific Question:   Supervising Provider    Answer:   Linford Arnold,  CATHERINE D [[id number]]    Follow-up: Return for [facility] management- started med in 4 weeks (telemed ok); CPE and FBW in 3-4 months.   Note:  This note was prepared with assistance of Dragon voice recognition software. Occasional wrong-word or sound-a-like substitutions may have  occurred due to the inherent limitations of voice recognition software.  Mayer Masker, PA-C

## 2020-04-08 NOTE — Patient Instructions (Signed)
Shoulder Pain Many things can cause shoulder pain, including:  An injury.  Moving the shoulder in the same way again and again (overuse).  Joint pain (arthritis). Pain can come from:  Swelling and irritation (inflammation) of any part of the shoulder.  An injury to the shoulder joint.  An injury to: ? Tissues that connect muscle to bone (tendons). ? Tissues that connect bones to each other (ligaments). ? Bones. Follow these instructions at home: Watch for changes in your symptoms. Let your doctor know about them. Follow these instructions to help with your pain. If you have a sling:  Wear the sling as told by your doctor. Remove it only as told by your doctor.  Loosen the sling if your fingers: ? Tingle. ? Become numb. ? Turn cold and blue.  Keep the sling clean.  If the sling is not waterproof: ? Do not let it get wet. ? Take the sling off when you shower or bathe. Managing pain, stiffness, and swelling   If told, put ice on the painful area: ? Put ice in a plastic bag. ? Place a towel between your skin and the bag. ? Leave the ice on for 20 minutes, 2-3 times a day. Stop putting ice on if it does not help with the pain.  Squeeze a soft ball or a foam pad as much as possible. This prevents swelling in the shoulder. It also helps to strengthen the arm. General instructions  Take over-the-counter and prescription medicines only as told by your doctor.  Keep all follow-up visits as told by your doctor. This is important. Contact a doctor if:  Your pain gets worse.  Medicine does not help your pain.  You have new pain in your arm, hand, or fingers. Get help right away if:  Your arm, hand, or fingers: ? Tingle. ? Are numb. ? Are swollen. ? Are painful. ? Turn white or blue. Summary  Shoulder pain can be caused by many things. These include injury, moving the shoulder in the same away again and again, and joint pain.  Watch for changes in your symptoms.  Let your doctor know about them.  This condition may be treated with a sling, ice, and pain medicine.  Contact your doctor if the pain gets worse or you have new pain. Get help right away if your arm, hand, or fingers tingle or get numb, swollen, or painful.  Keep all follow-up visits as told by your doctor. This is important. This information is not intended to replace advice given to you by your health care provider. Make sure you discuss any questions you have with your health care provider. Document Revised: 01/02/2018 Document Reviewed: 01/02/2018 Elsevier Patient Education  2020 [employer]    Shoulder Impingement Syndrome  Shoulder impingement syndrome is a condition that causes pain when connective tissues (tendons) surrounding the shoulder joint become pinched. These tendons are part of the group of muscles and tissues that help to stabilize the shoulder (rotator cuff). Beneath the rotator cuff is a fluid-filled sac (bursa) that allows the muscles and tendons to glide smoothly. The bursa may become swollen or irritated (bursitis). Bursitis, swelling in the rotator cuff tendons, or both conditions can decrease how much space is under a bone in the shoulder joint (acromion), resulting in impingement. What are the causes? Shoulder impingement syndrome may be caused by bursitis or swelling of the rotator cuff tendons, which may result from:  Repetitive overhead arm movements.  Falling onto the shoulder.  Weakness in the shoulder muscles. What increases the risk? You may be more likely to develop this condition if you:  Play sports that involve throwing, such as baseball.  Participate in sports such as tennis, volleyball, and swimming.  Work as a Education administrator, Music therapist, or Pharmacologist. Some people are also more likely to develop impingement syndrome because of the shape of their acromion bone. What are the signs or symptoms? The main symptom of this condition is pain on the  front or side of the shoulder. The pain may:  Get worse when lifting or raising the arm.  Get worse at night.  Wake you up from sleeping.  Feel sharp when the shoulder is moved and then fade to an ache. Other symptoms may include:  Tenderness.  Stiffness.  Inability to raise the arm above shoulder level or behind the body.  Weakness. How is this diagnosed? This condition may be diagnosed based on:  Your symptoms and medical history.  A physical exam.  Imaging tests, such as: ? X-rays. ? MRI. ? Ultrasound. How is this treated? This condition may be treated by:  Resting your shoulder and avoiding all activities that cause pain or put stress on the shoulder.  Icing your shoulder.  NSAIDs to help reduce pain and swelling.  One or more injections of medicines to numb the area and reduce inflammation.  Physical therapy.  Surgery. This may be needed if nonsurgical treatments have not helped. Surgery may involve repairing the rotator cuff, reshaping the acromion, or removing the bursa. Follow these instructions at home: Managing pain, stiffness, and swelling   If directed, put ice on the injured area. ? Put ice in a plastic bag. ? Place a towel between your skin and the bag. ? Leave the ice on for 20 minutes, 2-3 times a day. Activity  Rest and return to your normal activities as told by your health care provider. Ask your health care provider what activities are safe for you.  Do exercises as told by your health care provider. General instructions  Do not use any products that contain nicotine or tobacco, such as cigarettes, e-cigarettes, and chewing tobacco. These can delay healing. If you need help quitting, ask your health care provider.  Ask your health care provider when it is safe for you to drive.  Take over-the-counter and prescription medicines only as told by your health care provider.  Keep all follow-up visits as told by your health care provider.  This is important. How is this prevented?  Give your body time to rest between periods of activity.  Be safe and responsible while being active. This will help you avoid falls.  Maintain physical fitness, including strength and flexibility. Contact a health care provider if:  Your symptoms have not improved after 1-2 months of treatment and rest.  You cannot lift your arm away from your body. Summary  Shoulder impingement syndrome is a condition that causes pain when connective tissues (tendons) surrounding the shoulder joint become pinched.  The main symptom of this condition is pain on the front or side of the shoulder.  This condition is usually treated with rest, ice, and pain medicines as needed. This information is not intended to replace advice given to you by your health care provider. Make sure you discuss any questions you have with your health care provider. Document Revised: 10/12/2018 Document Reviewed: 12/13/2017 Elsevier Patient Education  2020 [employer].

## 2020-04-17 ENCOUNTER — Other Ambulatory Visit: Payer: Self-pay

## 2020-04-17 ENCOUNTER — Encounter: Payer: Self-pay | Admitting: Family Medicine

## 2020-04-17 ENCOUNTER — Ambulatory Visit: Payer: BC Managed Care – PPO | Admitting: Family Medicine

## 2020-04-17 VITALS — BP 131/76 | Ht 67.0 in | Wt 230.0 lb

## 2020-04-17 DIAGNOSIS — M7582 Other shoulder lesions, left shoulder: Secondary | ICD-10-CM

## 2020-04-17 DIAGNOSIS — M25512 Pain in left shoulder: Secondary | ICD-10-CM

## 2020-04-17 MED ORDER — MELOXICAM 15 MG PO TABS: 15.0000 mg | ORAL_TABLET | Freq: Every day | ORAL | 0 refills | Status: DC

## 2020-04-17 NOTE — Progress Notes (Signed)
PCP: Mayer Masker, PA-C  Subjective:   HPI: Patient is a 54 y.o. female here for evaluation of left shoulder pain.  Patient reports that several years ago she was picking up her child and felt pain in her shoulder and intermittently has had shoulder pains however about 3 months ago, she started having progressive worsening of left shoulder pain.  She not have any new injuries or trauma at that time.  She has never had surgery on her shoulder.  She describes pain is constant but worse when she reaches overhead.  Especially bad when she is trying to fix her hair or grabbing items from cabinets overhead.  Is also aggravated by laying on her left shoulder.  She points to her lateral shoulder as the area of pain though feels it is deep in her shoulder.  She has tried heat, ice, liniments, ibuprofen.  All have been briefly helpful but none resolve her pain.  She ranks as a 7/10 at baseline and 10/10 at worst.  She denies any neck pain, radicular symptoms, numbness or tingling anywhere.  She denies any weakness.  Review of Systems:  Per HPI.   PMFSH, medications and smoking status reviewed.      Objective:  Physical Exam:  [facility] 04/17/2020  Frequency of aerobic exercise (# of days/week) 3  Average time in minutes 30  Frequency of strengthening activities (# of days/week) 0     Gen: awake, alert, NAD, comfortable in exam room Pulm: breathing unlabored  Left shoulder: -Inspection: no obvious deformity, atrophy, or asymmetry. No bruising. No swelling -Palpation: no TTP over [facility] joint or bicipital groove.  Mildly tender laterally over the deltoid. -ROM: Range of motion limited due to pain with flexion (0 to 110 degrees), abduction (0 to 120 degrees), full range of motion with internal and external rotation. NV intact distally Normal scapular function observed. Special Tests:  - Impingement:  Positive Hawkins, positive neers, positive empty can sign for  pain. - Supraspinatus: Positive empty can sign for pain.  5/5 strength with resisted flexion at 20 degrees - Infraspinatus/Teres Minor: 5/5 strength with ER - Subscapularis: 5/5 strength with IR - Biceps tendon: Neg Speeds - Labrum: Negative Obriens, good stability -Positive painful arc, no drop arm sign  [place] L shoulder: -Biceps tendon: Well visualized within the bicipital groove and without any abnormalities -Pectoralis: Insertion visualized and without abnormalities. -Subscapularis: Well visualized to insertion point on humerus.  There is a very small area of hypoechogenicity on the articular surface, otherwise no abnormalities.  Dynamic testing over the coracoid did not show signs of impingement. -AC joint: No osteophytes, no significant separation, negative geyser sign. -Supraspinatus: Well-visualized and with significant cortical irregularity along with thinning of the distal insertion.  Dynamic testing did not reveal signs of impingement. -Subacromial bursa: No obvious enlargement or swelling. -Infraspinatus/teres minor: Insertion point on posterior humerus visualized and without abnormalities.  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     Assessment & Plan:  1.  Supraspinatus tendinopathy Patient with physical exam findings and ultrasound findings consistent with supraspinatus tendinopathy and likely a distant partial tear.  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  We will plan to manage conservatively for now with formal physical therapy referral, meloxicam 15 mg daily, and follow-up in 1 month.  We discussed that if these measures are unsuccessful  or minimally successful would consider corticosteroid injection and/or nitroglycerin patches.   Guy Sandifer, MD [facility] 04/17/2020 9:47 AM

## 2020-04-17 NOTE — Progress Notes (Signed)
[facility]: Attending Note: I have reviewed the chart, discussed wit the Sports Medicine Fellow. I agree with assessment and treatment plan as detailed in the Fellow's note.  

## 2020-04-23 ENCOUNTER — Other Ambulatory Visit: Payer: Self-pay

## 2020-04-23 MED ORDER — MELOXICAM 15 MG PO TABS: 15.0000 mg | ORAL_TABLET | Freq: Every day | ORAL | 0 refills | Status: DC

## 2020-04-23 NOTE — Progress Notes (Signed)
Pt states pharmacy never received Rx on Friday. Will re-send to pharmacy.

## 2020-05-05 ENCOUNTER — Other Ambulatory Visit: Payer: Self-pay

## 2020-05-05 ENCOUNTER — Ambulatory Visit: Payer: BC Managed Care – PPO | Attending: Family Medicine

## 2020-05-05 DIAGNOSIS — M25512 Pain in left shoulder: Secondary | ICD-10-CM

## 2020-05-05 DIAGNOSIS — M25612 Stiffness of left shoulder, not elsewhere classified: Secondary | ICD-10-CM | POA: Insufficient documentation

## 2020-05-05 NOTE — Patient Instructions (Signed)
Scapula elevation /depression/retraction 2-3 reps 6-8x/day LT

## 2020-05-05 NOTE — Therapy (Signed)
[facility] [address] [place], [place], [address] Phone: [phone]   Fax:  [phone]  Physical Therapy Evaluation  Patient Details  Name: Makayla Matthews MRN: [id number] Date of Birth: [birth year] Referring Provider (PT): Denny Levy, MD   Encounter Date: 05/05/2020   PT End of Session - 05/05/20 1646    Visit Number 1    Number of Visits 12    Date for PT Re-Evaluation 06/12/20    Authorization Type BCBS    PT Start Time 0415    PT Stop Time 0455    PT Time Calculation (min) 40 min    Activity Tolerance Patient limited by pain    Behavior During Therapy [facility] for tasks assessed/performed           Past Medical History:  Diagnosis Date  . Anxiety   . Back pain   . Depression   . Pre-diabetes   . Vitamin D deficiency     Past Surgical History:  Procedure Laterality Date  . CESAREAN SECTION  2004    There were no vitals filed for this visit.    Subjective Assessment - 05/05/20 1618    Subjective She reports  Lt shoulder pain. Not able to lift overhead.  No injury with onset.   MD  said she has and reports RTC had [place] and RTC is jagged and inflammed. Has pain meds.  No injection.    Limitations Lifting   hang pics , hair care, dressing  ,carring purse on LT , driving,   Diagnostic tests [place]    Patient Stated Goals She wants to improve strength to help kids    Currently in Pain? No/denies   at rest   Pain Score 9    in past week   Pain Location Shoulder    Pain Orientation Left;Lateral    Pain Descriptors / Indicators Pressure;Throbbing;Sharp    Pain Type Chronic pain    Pain Onset More than a month ago    Pain Frequency Intermittent    Aggravating Factors  using LT arm    Pain Relieving Factors rest , stop using arm              [facility] PT Assessment - 05/05/20 0001      Assessment   Medical Diagnosis Lt shoulder pain    Referring Provider (PT) Denny Levy, MD    Onset Date/Surgical Date --   4 months  ago   Hand Dominance Right    Next MD Visit not sure    Prior Therapy no      Precautions   Precautions None      Restrictions   Weight Bearing Restrictions No      Balance Screen   Has the patient fallen in the past 6 months No      Prior Function   Level of Independence Independent    Vocation Full time employment    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      Cognition   Overall Cognitive Status Within Functional Limits for tasks assessed      Observation/Other Assessments   Focus on Therapeutic Outcomes (FOTO)  47%  with expected progress to  68%      Posture/Postural Control   Posture Comments rounded shoulders      ROM / Strength   AROM / PROM / Strength AROM;PROM;Strength      AROM   Overall AROM Comments scapula elevation and depression decr    AROM Assessment  Site Shoulder    Right/Left Shoulder Left    Left Shoulder Flexion 76 Degrees    Left Shoulder ABduction 75 Degrees    Left Shoulder Internal Rotation 67 Degrees   able to reach behind back equal to RT    Left Shoulder External Rotation 15 Degrees    Left Shoulder Horizontal ABduction 15 Degrees    Left Shoulder Horizontal ADduction 100 Degrees      PROM   PROM Assessment Site Shoulder    Right/Left Shoulder Left    Left Shoulder Flexion 115 Degrees    Left Shoulder ABduction 115 Degrees    Left Shoulder Internal Rotation 70 Degrees    Left Shoulder External Rotation 65 Degrees      Strength   Overall Strength Comments WNl but with pain with flexion and abduction                      Objective measurements completed on examination: See above findings.               PT Education - 05/05/20 1658    Education Details POC , HEp    Person(s) Educated Patient    Methods Explanation;Demonstration;Tactile cues;Verbal cues;Handout    Comprehension Verbalized understanding;Returned demonstration            PT Short Term Goals - 05/05/20 1658      PT SHORT  TERM GOAL #1   Title Review FOTO score    Time 1    Period Weeks    Status New      PT SHORT TERM GOAL #2   Title Independent with initial hEP    Time 3    Period Weeks    Status New      PT SHORT TERM GOAL #3   Title Improved AROM to 125 deg flexion and abduction    Time 3    Period Weeks    Status New             PT Long Term Goals - 05/05/20 1659      PT LONG TERM GOAL #1   Title She will be indpendent with all HEP issued.    Time 6    Period Weeks    Status New      PT LONG TERM GOAL #2   Title She will be able to lift 3 # overhead  with min pain    Time 6    Period Weeks    Status New      PT LONG TERM GOAL #3   Title FOTO scor e  improved  to 68%    Time 6    Period Weeks    Status New      PT LONG TERM GOAL #4   Title AROM LT shoulder equal RT.    Time 6    Period Weeks    Status New      PT LONG TERM GOAL #5   Title Able to do self hair care with min to no pain    Time 6    Period Weeks    Status New                  Plan - 05/05/20 1626    Clinical Impression Statement [name] presents with Lt should pain on movement and decrease aaROM and passoive ROM. Some pain with MMT.   Decreased scapula motion.  Some impingment signs. Rounded shoulder posture.   She  should improve with skilled PT and consistent HEP.    Personal Factors and Comorbidities Time since onset of injury/illness/exacerbation;Fitness    Examination-Activity Limitations Bed Mobility;Reach Overhead;Lift;Carry;Bathing;Sleep    Examination-Participation Restrictions Cleaning;Occupation;Meal Prep;Laundry    Stability/Clinical Decision Making Stable/Uncomplicated    Clinical Decision Making Low    PT Frequency 2x / week    PT Duration 6 weeks    PT Treatment/Interventions Taping;Passive range of motion;Dry needling;Manual techniques;Therapeutic exercise;Patient/family education;Ultrasound;Cryotherapy;Electrical Stimulation;Iontophoresis 4mg /ml Dexamethasone;Moist Heat    PT  Next Visit Plan REveiw HEP . Manual for pain and ROM.   Modalities  for pain    PT Home Exercise Plan scapula retraction and elafation and depression    Consulted and Agree with Plan of Care Patient           Patient will benefit from skilled therapeutic intervention in order to improve the following deficits and impairments:  Decreased strength, Impaired UE functional use, Pain, Decreased range of motion, Decreased activity tolerance  Visit Diagnosis: Left shoulder pain, unspecified chronicity - Plan: PT plan of care cert/re-cert  Stiffness of left shoulder, not elsewhere classified - Plan: PT plan of care cert/re-cert     Problem List Patient Active Problem List   Diagnosis Date Noted  . Class 2 obesity due to excess calories with body mass index (BMI) of 38.0 to 38.9 in adult 05/28/2019  . Back pain 01/23/2019  . Family history of type 2 diabetes mellitus in mother 01/15/2019  . Family history of Alzheimer's disease- mother passed age 31 01/15/2019  . Psychophysiological insomnia 01/15/2019  . Adjustment disorder with mixed anxiety and depressed mood 01/15/2019  . Morbid obesity (HCC) 08/07/2018  . Localized swelling of right foot 08/07/2018  . Noncompliance with diet and medication regimen 08/07/2018  .  High risk for OSA-  declines referral 08/07/2018  . Elevated blood pressure reading 02/05/2018  . Prediabetes 02/05/2018  . Family history of thyroid disease 02/05/2018  . Environmental and seasonal allergies 02/05/2018  . Venous insufficiency (chronic) (peripheral)- R >etr L LExt 02/05/2018  . Vitamin D insufficiency 02/05/2018    04/07/2018  PT 05/05/2020, 6:02 PM  [facility] [address] [place], [place], [place] Phone: [phone]   Fax:  [phone]  Name: Makayla Matthews MRN: Henriette Combs Date of Birth: [birth year]

## 2020-05-06 ENCOUNTER — Encounter: Payer: Self-pay | Admitting: Physician Assistant

## 2020-05-06 ENCOUNTER — Ambulatory Visit (INDEPENDENT_AMBULATORY_CARE_PROVIDER_SITE_OTHER): Payer: BC Managed Care – PPO | Admitting: Physician Assistant

## 2020-05-06 VITALS — BP 128/80 | Temp 98.6°F | Ht 66.5 in | Wt 240.0 lb

## 2020-05-06 DIAGNOSIS — F5104 Psychophysiologic insomnia: Secondary | ICD-10-CM | POA: Diagnosis not present

## 2020-05-06 DIAGNOSIS — Z6838 Body mass index (BMI) 38.0-38.9, adult: Secondary | ICD-10-CM

## 2020-05-06 DIAGNOSIS — E6609 Other obesity due to excess calories: Secondary | ICD-10-CM

## 2020-05-06 DIAGNOSIS — J3089 Other allergic rhinitis: Secondary | ICD-10-CM

## 2020-05-06 MED ORDER — TRAZODONE HCL 100 MG PO TABS: 100.0000 mg | ORAL_TABLET | Freq: Every evening | ORAL | 1 refills | Status: DC | PRN

## 2020-05-06 MED ORDER — PHENTERMINE HCL 37.5 MG PO TABS: 37.5000 mg | ORAL_TABLET | Freq: Every day | ORAL | 0 refills | Status: DC

## 2020-05-06 MED ORDER — LEVOCETIRIZINE DIHYDROCHLORIDE 5 MG PO TABS: 5.0000 mg | ORAL_TABLET | Freq: Every evening | ORAL | 1 refills | Status: DC

## 2020-05-06 NOTE — Progress Notes (Signed)
Telehealth office visit note for Makayla Masker, PA-C- at Primary Care at [facility]   I connected with current patient today by telephone and verified that I am speaking with the correct person    Location of the patient: Home  Location of the provider: Office - This visit type was conducted due to national recommendations for restrictions regarding the COVID-19 Pandemic (e.g. social distancing) in an effort to limit this patient's exposure and mitigate transmission in our community.    - No physical exam could be performed with this format, beyond that communicated to [place] by the patient/ family members as noted.   - Additionally my office staff/ schedulers were to discuss with the patient that there may be a monetary charge related to this service, depending on their medical insurance.  My understanding is that patient understood and consented to proceed.     _________________________________________________________________________________   History of Present Illness: Patient calls in to follow-up on weight management.  Patient was started on phentermine at last office visit and is tolerating medication without issues.  Patient reports she is eating smaller meals with intermittent snacks, has increased water consumption and is walking for 30 to 45 minutes at least twice per week and is planning to join the [employer] due to colder weather.  Has been checking blood pressure and reports average readings have been 120s/70-80s. states has decreased her nibbling.  Currently is having allergy symptoms of sneezing and sinus headache.  Has been using the Nettie pot which has provided some relief. Also taking Zyrtec which she has been on for quite some time.  Insomnia: Takes trazodone which helps with sleep.  States usually takes it during the week but not on the weekends.     GAD 7 : Generalized Anxiety Score 10/08/2019 01/15/2019  Nervous, Anxious, on Edge 0 2  Control/stop worrying 1 3  Worry  too much - different things 0 3  Trouble relaxing 0 2  Restless 0 0  Easily annoyed or irritable 2 3  Afraid - awful might happen 0 3  Total GAD 7 Score 3 16  Anxiety Difficulty Somewhat difficult -    Depression screen [facility] 2/9 05/06/2020 04/08/2020 10/08/2019 05/15/2019 02/21/2019  Decreased Interest 0 0 1 1 1   Down, Depressed, Hopeless 0 0 1 0 0  PHQ - 2 Score 0 0 2 1 1   Altered sleeping 0 0 0 0 0  Tired, decreased energy 0 1 0 1 0  Change in appetite 0 1 0 0 0  Feeling bad or failure about yourself  0 0 1 0 1  Trouble concentrating 0 0 0 0 0  Moving slowly or fidgety/restless 0 0 0 0 0  Suicidal thoughts 0 0 0 0 0  PHQ-9 Score 0 2 3 2 2   Difficult doing work/chores - Not difficult at all Somewhat difficult Not difficult at all Not difficult at all  Some recent data might be hidden      Impression and Recommendations:     1. Class 2 obesity due to excess calories without serious comorbidity with body mass index (BMI) of 38.0 to 38.9 in adult   2. Psychophysiological insomnia   3. Environmental and seasonal allergies     Class II obesity: -Patient has lost approximately 7 pounds since starting phentermine. -PDMP reviewed, no aberrancies noted. Provided refill. -Continue ambulatory BP and pulse monitoring.  -Encouraged to continue with dietary and lifestyle modifications. -Follow-up in 4 weeks for weight management.  Environmental and seasonal allergies: -Continue to use Nettie pot and recommend switching to Xyzal, which patient is agreeable to. -Recommend to use Flonase nasal spray.  Psychophysiological insomnia: -Stable -Continue trazodone as needed for sleep.  Provided refill. -Recommend to avoid/reduce caffeinated beverages.   - As part of my medical decision making, I reviewed the following data within the electronic MEDICAL RECORD NUMBER History obtained from pt /family, CMA notes reviewed and incorporated if applicable, Labs reviewed, Radiograph/ tests reviewed if  applicable and OV notes from prior OV's with me, as well as any other specialists she/he has seen since seeing me last, were all reviewed and used in my medical decision making process today.    - Additionally, when appropriate, discussion had with patient regarding our treatment plan, and their biases/concerns about that plan were used in my medical decision making today.    - The patient agreed with the plan and demonstrated an understanding of the instructions.   No barriers to understanding were identified.     - The patient was advised to call back or seek an in-person evaluation if the symptoms worsen or if the condition fails to improve as anticipated.   Return in about 4 weeks (around 06/03/2020) for Wt.    No orders of the defined types were placed in this encounter.   Meds ordered this encounter  Medications   traZODone (DESYREL) 100 MG tablet    Sig: Take 1 tablet (100 mg total) by mouth at bedtime as needed for sleep.    Dispense:  90 tablet    Refill:  1   levocetirizine (XYZAL) 5 MG tablet    Sig: Take 1 tablet (5 mg total) by mouth every evening.    Dispense:  90 tablet    Refill:  1    Order Specific Question:   Supervising Provider    Answer:   Nani Gasser D [[id number]]   phentermine (ADIPEX-P) 37.5 MG tablet    Sig: Take 1 tablet (37.5 mg total) by mouth daily before breakfast.    Dispense:  30 tablet    Refill:  0    Order Specific Question:   Supervising Provider    Answer:   Nani Gasser D [[id number]]    Medications Discontinued During This Encounter  Medication Reason   cetirizine (ZYRTEC) 10 MG tablet Change in therapy   phentermine (ADIPEX-P) 37.5 MG tablet Reorder   traZODone (DESYREL) 100 MG tablet Reorder       Time spent on visit including pre-visit chart review and post-visit care was 15 minutes.      The 21st Century Cures Act was signed into law in 2016 which includes the topic of electronic health records.  This provides  immediate access to information in MyChart.  This includes consultation notes, operative notes, office notes, lab results and pathology reports.  If you have any questions about what you read please let us know at your next visit or call us at the office.  We are right here with you.  Note:  This note was prepared with assistance of Dragon voice recognition software. Occasional wrong-word or sound-a-like substitutions may have occurred due to the inherent limitations of voice recognition software.  __________________________________________________________________________________     Patient Care Team    Relationship Specialty Notifications Start End  Makayla Matthews, [place] PCP - General   11/03/19   Arnette Felts, FNP  General Practice  08/09/18      -Vitals obtained; medications/ allergies reconciled;  personal medical, social, Sx  etc.histories were updated by CMA, reviewed by me and are reflected in chart   Patient Active Problem List   Diagnosis Date Noted   Class 2 obesity due to excess calories with body mass index (BMI) of 38.0 to 38.9 in adult 05/28/2019   Back pain 01/23/2019   Family history of type 2 diabetes mellitus in mother 01/15/2019   Family history of Alzheimer's disease- mother passed age 12 01/15/2019   Psychophysiological insomnia 01/15/2019   Adjustment disorder with mixed anxiety and depressed mood 01/15/2019   Morbid obesity (HCC) 08/07/2018   Localized swelling of right foot 08/07/2018   Noncompliance with diet and medication regimen 08/07/2018    High risk for OSA-  declines referral 08/07/2018   Elevated blood pressure reading 02/05/2018   Prediabetes 02/05/2018   Family history of thyroid disease 02/05/2018   Environmental and seasonal allergies 02/05/2018   Venous insufficiency (chronic) (peripheral)- R >etr L LExt 02/05/2018   Vitamin D insufficiency 02/05/2018     Current Meds  Medication Sig   azelastine (ASTELIN) 0.1 % nasal spray  1 spray each nostril twice daily after sinus rinses   Cholecalciferol (VITAMIN D3) 1.25 MG (50000 UT) CAPS Take 1 capsule by mouth once a week.   FLUoxetine (PROZAC) 40 MG capsule Take 1 capsule (40 mg total) by mouth daily.   meloxicam (MOBIC) 15 MG tablet Take 1 tablet (15 mg total) by mouth daily.   Multiple Vitamin (MULTIVITAMIN WITH MINERALS) TABS tablet Take 1 tablet by mouth daily.   phentermine (ADIPEX-P) 37.5 MG tablet Take 1 tablet (37.5 mg total) by mouth daily before breakfast.   traZODone (DESYREL) 100 MG tablet Take 1 tablet (100 mg total) by mouth at bedtime as needed for sleep.   [DISCONTINUED] cetirizine (ZYRTEC) 10 MG tablet Take 1 tablet (10 mg total) by mouth daily.   [DISCONTINUED] phentermine (ADIPEX-P) 37.5 MG tablet Take 0.5 tablet by mouth x 7 days. Then take 1 tablet by mouth.   [DISCONTINUED] traZODone (DESYREL) 100 MG tablet Take 1 tablet (100 mg total) by mouth at bedtime as needed for sleep.     Allergies:  Allergies  Allergen Reactions   Codeine Itching     ROS:  See above HPI for pertinent positives and negatives   Objective:   Blood pressure 128/80, temperature 98.6 F (37 C), height 5' 6.5" (1.689 m), weight 240 lb (108.9 kg).  (if some vitals are omitted, this means that patient was UNABLE to obtain them even though they were asked to get them prior to OV today.  They were asked to call us at their earliest convenience with these once obtained. ) General: A & O * 3; sounds in no acute distress; in usual state of health.  Respiratory: speaking in full sentences, no conversational dyspnea Psych: insight appears good, mood- appears full

## 2020-05-13 ENCOUNTER — Ambulatory Visit (INDEPENDENT_AMBULATORY_CARE_PROVIDER_SITE_OTHER): Payer: BC Managed Care – PPO | Admitting: Family Medicine

## 2020-05-13 ENCOUNTER — Other Ambulatory Visit: Payer: Self-pay

## 2020-05-13 ENCOUNTER — Encounter: Payer: Self-pay | Admitting: Family Medicine

## 2020-05-13 VITALS — BP 131/83 | Ht 66.0 in | Wt 240.0 lb

## 2020-05-13 DIAGNOSIS — M7582 Other shoulder lesions, left shoulder: Secondary | ICD-10-CM

## 2020-05-13 MED ORDER — METHYLPREDNISOLONE ACETATE 40 MG/ML IJ SUSP
40.0000 mg | Freq: Once | INTRAMUSCULAR | Status: AC
  Administered 2020-05-13: 40 mg via INTRA_ARTICULAR

## 2020-05-13 NOTE — Progress Notes (Signed)
   PCP: Mayer Masker, PA-C  Subjective:   HPI: Patient is a 54 y.o. female here for evaluation of left shoulder pain.  She was seen here on 04/17/2020, at that time was diagnosed with rotator cuff tendinopathy of both the supraspinatus and subscapularis.  She was treated with meloxicam, PT referral and plan for follow-up today.  She reports that since then, she has had 1 PT visit so it is hard to say how helpful with the pain.  She has continued to have similar pain and is very frustrated with it.  No new trauma or injury, the pain feels the same as last time.  Has not noticed too much benefit from the meloxicam.   Review of Systems:  Per HPI.   PMFSH, medications and smoking status reviewed.      Objective:  Physical Exam:  [facility] 04/17/2020 05/13/2020  Frequency of aerobic exercise (# of days/week) 3 3  Average time in minutes 30 30  Frequency of strengthening activities (# of days/week) 0 0     Gen: awake, alert, NAD, comfortable in exam room Pulm: breathing unlabored  Left shoulder: -Inspection: no obvious deformity, atrophy, or asymmetry. No bruising. No swelling -Palpation: no TTP over [employer] joint or bicipital groove.  Mildly tender laterally over the deltoid. -ROM: Range of motion limited due to pain with flexion (0 to 110 degrees), abduction (0 to 120 degrees), full range of motion with internal and external rotation. NV intact distally Normal scapular function observed. Special Tests:  - Impingement: PositiveHawkins, positiveneers, positiveempty can sign for pain. - Supraspinatus: Positive empty can sign for pain. 5/5 strength with resisted flexion at 20 degrees - Infraspinatus/Teres Minor: 5/5 strength with ER - Subscapularis: 5/5 strength with IR - Biceps tendon:NegSpeeds - Labrum: Negative Obriens,good stability -Positive painful arc, no drop arm sign   Assessment & Plan:  1.  Rotator cuff tendinopathy  Patient with known  supraspinatus tendinopathy seen on ultrasound at last visit.  Unsurprising that she has not had significant improvement given that she is only had 1 physical therapy session.  She is in significant pain today, and would like to trial a corticosteroid injection which I think is reasonable.  This was performed today without any complications.  We will plan to continue with physical therapy, and follow-up in 1 month for reevaluation.  Could consider nitroglycerin patches at that time if continued symptoms.  Procedure note: Following the description of risks including infection bleeding, damage to surrounding structures, patient provided written consent for left subacromial ultrasound guided corticosteroid injection procedure. Patient was sterilely prepped in the usual fashion with  alcohol swab. Following topical anesthetization with ethyl chloride the subacromial space was identified under ultrasound guidance and injected with a solution of 2 cc of bupivacaine and 1 cc of Depo-Medrol.  Please see associated documentation for ultrasound images.  Patient tolerated well without complication. Precautions provided.   Guy Sandifer, MD [facility] Fellow 05/13/2020 1:39 PM  Addendum:  Patient seen in the office by fellow.  His history, exam, plan of care were precepted with me.  Norton Blizzard MD Marrianne Mood

## 2020-05-14 ENCOUNTER — Encounter: Payer: Self-pay | Admitting: Physical Therapy

## 2020-05-14 ENCOUNTER — Ambulatory Visit: Payer: BC Managed Care – PPO | Admitting: Physical Therapy

## 2020-05-14 ENCOUNTER — Other Ambulatory Visit: Payer: Self-pay

## 2020-05-14 DIAGNOSIS — M25512 Pain in left shoulder: Secondary | ICD-10-CM

## 2020-05-14 DIAGNOSIS — M25612 Stiffness of left shoulder, not elsewhere classified: Secondary | ICD-10-CM

## 2020-05-14 NOTE — Patient Instructions (Signed)
Access Code: [email or web address] URL: [email or web address] Date: 05/14/2020 Prepared by: Rosana Hoes  Exercises Seated Scapular Retraction - 2-3 x daily - 7 x weekly - 10 reps - 5 seconds hold Supine Shoulder Press with Dowel - 2-3 x daily - 7 x weekly - 10 reps - 5 seconds hold Seated Shoulder Flexion Towel Slide at Table Top - 2-3 x daily - 7 x weekly - 10 reps - 5 seconds hold Standing 'L' Stretch at [employer] - 2-3 x daily - 7 x weekly - 10 reps - 5 seconds hold Standing Single Arm Shoulder Flexion Towel Slide at Table Top - 2-3 x daily - 7 x weekly - 10 reps - 5 seconds hold

## 2020-05-14 NOTE — Therapy (Addendum)
[facility] Outpatient Rehabilitation [facility] [address] [place], [place], [address] Phone: [phone]   Fax:  [phone]  Physical Therapy Treatment  Patient Details  Name: Makayla Matthews MRN: [id number] Date of Birth: May 12, 1966 Referring Provider (PT): Denny Levy, MD   Encounter Date: 05/14/2020   PT End of Session - 05/14/20 1135    Visit Number 2    Number of Visits 12    Date for PT Re-Evaluation 06/12/20    Authorization Type BCBS    PT Start Time 1130    PT Stop Time 1200    PT Time Calculation (min) 30 min    Activity Tolerance Patient tolerated treatment well    Behavior During Therapy [facility] for tasks assessed/performed           Past Medical History:  Diagnosis Date  . Anxiety   . Back pain   . Depression   . Pre-diabetes   . Vitamin D deficiency     Past Surgical History:  Procedure Laterality Date  . CESAREAN SECTION  2004    There were no vitals filed for this visit.   Subjective Assessment - 05/14/20 1133    Subjective Patient reports she saw her doctor yesterday and had a steroid injection. She notes she is feeling better, she is a little sore since she was doing more than what she was supposed too.    Patient Stated Goals She wants to improve strength to help kids    Currently in Pain? No/denies              [facility] PT Assessment - 05/14/20 0001      Assessment   Medical Diagnosis Lt shoulder pain    Referring Provider (PT) Denny Levy, MD    Next MD Visit 06/10/2020      AROM   Left Shoulder Flexion 115 Degrees   shrug noted                        [facility] Adult PT Treatment/Exercise - 05/14/20 0001      Exercises   Exercises Shoulder      Shoulder Exercises: Supine   Flexion 10 reps   5 sec hold   Flexion Limitations dowel press to overhead stretch      Shoulder Exercises: Seated   Retraction 10 reps   5 sec hold   Retraction Limitations shoulder blade squeezes      Shoulder Exercises: Stretch    Table Stretch -Flexion Limitations Performed seated table slide, standing table slide, and standing step-back stretch for shoulder elevation 3 x 5 sec each      Manual Therapy   Manual Therapy Joint mobilization;Passive ROM    Joint Mobilization Left inferior and posterior mobs with shoulder at various ranges of elevation    Passive ROM Left shoulder all directions to tolerance                  PT Education - 05/14/20 1134    Education Details HEP, FOTO review    Person(s) Educated Patient    Methods Explanation;Demonstration;Verbal cues    Comprehension Verbalized understanding;Returned demonstration;Verbal cues required;Need further instruction            PT Short Term Goals - 05/14/20 1226      PT SHORT TERM GOAL #1   Title Review FOTO score    Time 1    Period Weeks    Status Achieved      PT  SHORT TERM GOAL #2   Title Independent with initial hEP    Time 3    Period Weeks    Status New      PT SHORT TERM GOAL #3   Title Improved AROM to 125 deg flexion and abduction    Time 3    Period Weeks    Status New             PT Long Term Goals - 05/05/20 1659      PT LONG TERM GOAL #1   Title She will be indpendent with all HEP issued.    Time 6    Period Weeks    Status New      PT LONG TERM GOAL #2   Title She will be able to lift 3 # overhead  with min pain    Time 6    Period Weeks    Status New      PT LONG TERM GOAL #3   Title FOTO scor e  improved  to 68%    Time 6    Period Weeks    Status New      PT LONG TERM GOAL #4   Title AROM LT shoulder equal RT.    Time 6    Period Weeks    Status New      PT LONG TERM GOAL #5   Title Able to do self hair care with min to no pain    Time 6    Period Weeks    Status New                 Plan - 05/14/20 1209    Clinical Impression Statement Patient tolerated therapy well with no adverse effects. Therapy focused on light manual and stretching since patient had injection  yesterday. She is presenting with symptoms consistent with frozen shoulder likely as a result of vaccine injection that inflamed the capsule, with secondary impingement symptoms. She reported improvement following light joint mobs to the shoulder and tolerated addition of stretching well with no increase in pain. She would benefit from continued skilled PT to progress her shoulder mobility and range of motion, and initiate rotator cuff and periscapular strengthening to improve overhead function.    PT Treatment/Interventions Taping;Passive range of motion;Dry needling;Manual techniques;Therapeutic exercise;Patient/family education;Ultrasound;Cryotherapy;Electrical Stimulation;Iontophoresis 4mg /ml Dexamethasone;Moist Heat    PT Next Visit Plan Review HEP and progress PRN. Manual for shoulder mobility and pain. Continue light stretching. Initiate light periscapular and rotator cuff strengthening as tolerated    PT Home Exercise Plan [id number]: Shoulder blade squeezes, options for shoulder elevation stretching (supine with dowel, seated/standing table slide, step back stretch)    Consulted and Agree with Plan of Care Patient           Patient will benefit from skilled therapeutic intervention in order to improve the following deficits and impairments:  Decreased strength, Impaired UE functional use, Pain, Decreased range of motion, Decreased activity tolerance  Visit Diagnosis: Left shoulder pain, unspecified chronicity  Stiffness of left shoulder, not elsewhere classified     Problem List Patient Active Problem List   Diagnosis Date Noted  . Class 2 obesity due to excess calories with body mass index (BMI) of 38.0 to 38.9 in adult 05/28/2019  . Back pain 01/23/2019  . Family history of type 2 diabetes mellitus in mother 01/15/2019  . Family history of Alzheimer's disease- mother passed age 65 01/15/2019  . Psychophysiological insomnia 01/15/2019  . Adjustment disorder with  mixed anxiety and  depressed mood 01/15/2019  . Morbid obesity (HCC) 08/07/2018  . Localized swelling of right foot 08/07/2018  . Noncompliance with diet and medication regimen 08/07/2018  .  High risk for OSA-  declines referral 08/07/2018  . Elevated blood pressure reading 02/05/2018  . Prediabetes 02/05/2018  . Family history of thyroid disease 02/05/2018  . Environmental and seasonal allergies 02/05/2018  . Venous insufficiency (chronic) (peripheral)- R >etr L LExt 02/05/2018  . Vitamin D insufficiency 02/05/2018    Rosana Hoes, PT, DPT, LAT, ATC 05/14/20  1:19 PM Phone: [phone] Fax: [phone]   [facility] Outpatient Rehabilitation [facility] [address] [place], [place], [address] Phone: [phone]   Fax:  [phone]  Name: Makayla Matthews MRN: [id number] Date of Birth: [birth year]

## 2020-05-19 ENCOUNTER — Ambulatory Visit: Payer: BC Managed Care – PPO

## 2020-05-19 ENCOUNTER — Other Ambulatory Visit: Payer: Self-pay

## 2020-05-19 DIAGNOSIS — M25512 Pain in left shoulder: Secondary | ICD-10-CM

## 2020-05-19 DIAGNOSIS — M25612 Stiffness of left shoulder, not elsewhere classified: Secondary | ICD-10-CM

## 2020-05-19 NOTE — Therapy (Signed)
[facility] Outpatient Rehabilitation [facility] [address] [place], [place], [address] Phone: [phone]   Fax:  [phone]  Physical Therapy Treatment  Patient Details  Name: Makayla Matthews MRN: [id number] Date of Birth: 01/25/1966 Referring Provider (PT): Denny Levy, MD   Encounter Date: 05/19/2020   PT End of Session - 05/19/20 1405    Visit Number 3    Number of Visits 12    Date for PT Re-Evaluation 06/12/20    Authorization Type BCBS    PT Start Time 1400    PT Stop Time 1440    PT Time Calculation (min) 40 min    Activity Tolerance Patient tolerated treatment well    Behavior During Therapy [name] for tasks assessed/performed           Past Medical History:  Diagnosis Date  . Anxiety   . Back pain   . Depression   . Pre-diabetes   . Vitamin D deficiency     Past Surgical History:  Procedure Laterality Date  . CESAREAN SECTION  2004    There were no vitals filed for this visit.   Subjective Assessment - 05/19/20 1404    Subjective Pt reports that her L shoulder is feeling "really good" and felt good after last session. She states she is still a little sore from the injection but not as much.    Limitations Lifting   hang pics , hair care, dressing  ,carring purse on LT , driving,   Diagnostic tests [place]    Patient Stated Goals She wants to improve strength to help kids    Currently in Pain? No/denies    Pain Score 0-No pain    Pain Location Shoulder    Pain Orientation Left;Lateral    Pain Onset More than a month ago              [facility] PT Assessment - 05/19/20 0001      Assessment   Medical Diagnosis Lt shoulder pain    Referring Provider (PT) Denny Levy, MD                         [facility] Adult PT Treatment/Exercise - 05/19/20 0001      Self-Care   Self-Care Other Self-Care Comments    Other Self-Care Comments  Reviewed/updated HEP, discussed GHJ capsular pattern and adhesive capsulitis      Shoulder  Exercises: Supine   Flexion 20 reps    Flexion Limitations with dowel; 5 second hold at end range FL    Other Supine Exercises Serratus punch with 5# on dowel      Shoulder Exercises: Standing   External Rotation Strengthening;Left;15 reps;Theraband    Theraband Level (Shoulder External Rotation) Level 3 (Green)    External Rotation Limitations Initially blue then switched to green    Internal Rotation Strengthening;Left;20 reps;Theraband    Theraband Level (Shoulder Internal Rotation) Level 3 (Green)    Extension Strengthening;Both;20 reps;Theraband    Theraband Level (Shoulder Extension) Level 4 (Blue)    Row Strengthening;Both;20 reps;Theraband    Theraband Level (Shoulder Row) Level 4 (Blue)      Shoulder Exercises: ROM/Strengthening   UBE (Upper Arm Bike) L2 3 min forward, 3 min backward    Wall Wash FL and ABD 10 x 5 sec hold at end range each      Shoulder Exercises: Stretch   External Rotation Stretch 2 reps;30 seconds   at doorway; attempted strap but painful at  this time     Manual Therapy   Manual Therapy Joint mobilization;Soft tissue mobilization    Joint Mobilization L GHJ inferior, posterior, and anterior joint mobilizations to tolerance in various ranges of motion    Soft tissue mobilization STM along lateral L shoulder    Passive ROM Left shoulder all directions to tolerance                  PT Education - 05/19/20 1537    Education Details Reviewed/updated HEP, discussed GHJ capsular pattern and adhesive capsulitis    Person(s) Educated Patient    Methods Explanation;Demonstration;Tactile cues;Verbal cues    Comprehension Verbalized understanding;Returned demonstration;Verbal cues required;Tactile cues required            PT Short Term Goals - 05/14/20 1226      PT SHORT TERM GOAL #1   Title Review FOTO score    Time 1    Period Weeks    Status Achieved      PT SHORT TERM GOAL #2   Title Independent with initial hEP    Time 3    Period  Weeks    Status New      PT SHORT TERM GOAL #3   Title Improved AROM to 125 deg flexion and abduction    Time 3    Period Weeks    Status New             PT Long Term Goals - 05/05/20 1659      PT LONG TERM GOAL #1   Title She will be indpendent with all HEP issued.    Time 6    Period Weeks    Status New      PT LONG TERM GOAL #2   Title She will be able to lift 3 # overhead  with min pain    Time 6    Period Weeks    Status New      PT LONG TERM GOAL #3   Title FOTO scor e  improved  to 68%    Time 6    Period Weeks    Status New      PT LONG TERM GOAL #4   Title AROM LT shoulder equal RT.    Time 6    Period Weeks    Status New      PT LONG TERM GOAL #5   Title Able to do self hair care with min to no pain    Time 6    Period Weeks    Status New                 Plan - 05/19/20 1408    Clinical Impression Statement Patient tolerated treatment session well with no adverse effects. Pt did well with initiation of periscapular and rotator cuff strengthening with no complaints of significant increase in pain. Pt did experience discomfort and pain during trial of L shoulder ER stretch with strap so pt performed stretch in doorway to tolerance instead.    Personal Factors and Comorbidities Time since onset of injury/illness/exacerbation;Fitness    Examination-Activity Limitations Bed Mobility;Reach Overhead;Lift;Carry;Bathing;Sleep    Examination-Participation Restrictions Cleaning;Occupation;Meal Prep;Laundry    Stability/Clinical Decision Making Stable/Uncomplicated    PT Frequency 2x / week    PT Duration 6 weeks    PT Treatment/Interventions Taping;Passive range of motion;Dry needling;Manual techniques;Therapeutic exercise;Patient/family education;Ultrasound;Cryotherapy;Electrical Stimulation;Iontophoresis 4mg /ml Dexamethasone;Moist Heat    PT Next Visit Plan Provide therabands for pt to allow for banded exercises  to be added to HEP. Review HEP and  progress PRN. Manual for shoulder mobility and pain. Continue light stretching and light periscapular and rotator cuff strengthening as tolerated.    PT Home Exercise Plan [id number]: Shoulder blade squeezes, options for shoulder elevation stretching (supine with dowel, seated/standing table slide, step back stretch), doorway shoulder ER stretch, serratus punch, wall wash    Consulted and Agree with Plan of Care Patient           Patient will benefit from skilled therapeutic intervention in order to improve the following deficits and impairments:  Decreased strength, Impaired UE functional use, Pain, Decreased range of motion, Decreased activity tolerance  Visit Diagnosis: Left shoulder pain, unspecified chronicity  Stiffness of left shoulder, not elsewhere classified     Problem List Patient Active Problem List   Diagnosis Date Noted  . Class 2 obesity due to excess calories with body mass index (BMI) of 38.0 to 38.9 in adult 05/28/2019  . Back pain 01/23/2019  . Family history of type 2 diabetes mellitus in mother 01/15/2019  . Family history of Alzheimer's disease- mother passed age 22 01/15/2019  . Psychophysiological insomnia 01/15/2019  . Adjustment disorder with mixed anxiety and depressed mood 01/15/2019  . Morbid obesity (HCC) 08/07/2018  . Localized swelling of right foot 08/07/2018  . Noncompliance with diet and medication regimen 08/07/2018  .  High risk for OSA-  declines referral 08/07/2018  . Elevated blood pressure reading 02/05/2018  . Prediabetes 02/05/2018  . Family history of thyroid disease 02/05/2018  . Environmental and seasonal allergies 02/05/2018  . Venous insufficiency (chronic) (peripheral)- R >etr L LExt 02/05/2018  . Vitamin D insufficiency 02/05/2018     Rhea Bleacher, PT, DPT 05/19/20 3:39 PM  [facility] Outpatient Rehabilitation [facility] [address] [place], [place], [address] Phone: [phone]   Fax:  [phone]  Name:  Makayla Matthews MRN: [id number] Date of Birth: Oct 21, 1965

## 2020-05-21 ENCOUNTER — Other Ambulatory Visit: Payer: Self-pay

## 2020-05-21 ENCOUNTER — Ambulatory Visit: Payer: BC Managed Care – PPO

## 2020-05-21 DIAGNOSIS — M25612 Stiffness of left shoulder, not elsewhere classified: Secondary | ICD-10-CM

## 2020-05-21 DIAGNOSIS — M25512 Pain in left shoulder: Secondary | ICD-10-CM | POA: Diagnosis not present

## 2020-05-21 NOTE — Therapy (Signed)
[facility] Outpatient Rehabilitation [facility] [address] [place], [place], [address] Phone: [phone]   Fax:  [phone]  Physical Therapy Treatment  Patient Details  Name: Makayla Matthews MRN: [id number] Date of Birth: 02-Apr-1966 Referring Provider (PT): Denny Levy, MD   Encounter Date: 05/21/2020   PT End of Session - 05/21/20 1403    Visit Number 4    Number of Visits 12    Date for PT Re-Evaluation 06/12/20    Authorization Type BCBS - FOTO at visit 6 and visit 10    PT Start Time 1403    PT Stop Time 1443    PT Time Calculation (min) 40 min    Activity Tolerance Patient tolerated treatment well    Behavior During Therapy [facility] for tasks assessed/performed           Past Medical History:  Diagnosis Date  . Anxiety   . Back pain   . Depression   . Pre-diabetes   . Vitamin D deficiency     Past Surgical History:  Procedure Laterality Date  . CESAREAN SECTION  2004    There were no vitals filed for this visit.   Subjective Assessment - 05/21/20 1448    Subjective Pt states her L shoulder is still feeling well overall with ~5/10 pain/soreness and reports the injection is still providing pain relief. She verbalizes compliance with HEP since last visit.    Limitations Lifting    Diagnostic tests [place]    Patient Stated Goals She wants to improve strength to help kids    Currently in Pain? Yes    Pain Score 5     Pain Location Shoulder    Pain Orientation Left;Lateral    Pain Descriptors / Indicators Pressure;Throbbing;Sharp    Pain Type Chronic pain    Pain Onset More than a month ago              [facility] PT Assessment - 05/21/20 0001      Assessment   Medical Diagnosis Lt shoulder pain    Referring Provider (PT) Denny Levy, MD                         [facility] Adult PT Treatment/Exercise - 05/21/20 0001      Self-Care   Self-Care Other Self-Care Comments    Other Self-Care Comments  Reviewed HEP and provided bands to  allow for completion of band interventions at home; explained benefits of GH joint mobilizations for mobility. Instructions to continue stretches and strengthening within pain-free range to tolerance.      Shoulder Exercises: Supine   Flexion 20 reps    Flexion Limitations with 5# on dowel; 5 second hold at end range FL    Other Supine Exercises Serratus punch with 5# on dowel x 20      Shoulder Exercises: Standing   External Rotation Strengthening;Left;15 reps;Theraband    Theraband Level (Shoulder External Rotation) Level 3 (Green)    Internal Rotation Strengthening;Left;20 reps;Theraband    Theraband Level (Shoulder Internal Rotation) Level 3 (Green)    Extension Strengthening;Both;20 reps;Theraband    Theraband Level (Shoulder Extension) Level 4 (Blue)    Row Strengthening;Both;20 reps;Theraband    Theraband Level (Shoulder Row) Level 4 (Blue)    Other Standing Exercises Red ball on wall 1 min counterclockwise and 30 sec clockwise limited due to fatigue and lateral L shoulder pain onset      Shoulder Exercises: ROM/Strengthening   UBE (Upper  Arm Bike) L2.5 3 min forward, 3 min backward    Wall Wash FL and ABD 10 x 5 sec hold at end range each      Shoulder Exercises: Stretch   Other Shoulder Stretches Supine L shoulder ER stretch with dowel 15 x 3 sec holds      Manual Therapy   Manual Therapy Joint mobilization;Soft tissue mobilization    Joint Mobilization L GHJ inferior, posterior, and anterior joint mobilizations to tolerance in various ranges of motion    Soft tissue mobilization STM and IASTM along lateral L shoulder    Passive ROM Left shoulder all directions to tolerance                  PT Education - 05/21/20 1450    Education Details Reviewed HEP and provided bands to allow for completion of band interventions at home; explained benefits of GH joint mobilizations for mobility. Instructions to continue stretches and strengthening within pain-free range to  tolerance.    Person(s) Educated Patient    Methods Explanation;Demonstration;Tactile cues;Verbal cues    Comprehension Verbalized understanding;Returned demonstration;Verbal cues required;Tactile cues required            PT Short Term Goals - 05/14/20 1226      PT SHORT TERM GOAL #1   Title Review FOTO score    Time 1    Period Weeks    Status Achieved      PT SHORT TERM GOAL #2   Title Independent with initial hEP    Time 3    Period Weeks    Status New      PT SHORT TERM GOAL #3   Title Improved AROM to 125 deg flexion and abduction    Time 3    Period Weeks    Status New             PT Long Term Goals - 05/05/20 1659      PT LONG TERM GOAL #1   Title She will be indpendent with all HEP issued.    Time 6    Period Weeks    Status New      PT LONG TERM GOAL #2   Title She will be able to lift 3 # overhead  with min pain    Time 6    Period Weeks    Status New      PT LONG TERM GOAL #3   Title FOTO scor e  improved  to 68%    Time 6    Period Weeks    Status New      PT LONG TERM GOAL #4   Title AROM LT shoulder equal RT.    Time 6    Period Weeks    Status New      PT LONG TERM GOAL #5   Title Able to do self hair care with min to no pain    Time 6    Period Weeks    Status New                 Plan - 05/21/20 1451    Clinical Impression Statement Pt tolerated interventions well with complaints of fatigue and minimal lateral L shoulder pain but did not experience significant increase in symptoms. Pt demonstrated slightly improved passive L shoulder ABD following inferior joint mobilizations. She continues to have pain in lateral shoulder/upper arm with L shoulder ER following IASTM and anterior joint mobilization.    Personal Factors  and Comorbidities Time since onset of injury/illness/exacerbation;Fitness    Examination-Activity Limitations Bed Mobility;Reach Overhead;Lift;Carry;Bathing;Sleep    Examination-Participation Restrictions  Cleaning;Occupation;Meal Prep;Laundry    Stability/Clinical Decision Making Stable/Uncomplicated    PT Frequency 2x / week    PT Duration 6 weeks    PT Treatment/Interventions Taping;Passive range of motion;Dry needling;Manual techniques;Therapeutic exercise;Patient/family education;Ultrasound;Cryotherapy;Electrical Stimulation;Iontophoresis 4mg /ml Dexamethasone;Moist Heat    PT Next Visit Plan Review HEP and progress PRN. Manual for shoulder mobility and pain. Continue light stretching and light periscapular and rotator cuff strengthening as tolerated.    PT Home Exercise Plan [id number]: Shoulder blade squeezes, options for shoulder elevation stretching (supine with dowel, seated/standing table slide, step back stretch), doorway shoulder ER stretch, serratus punch, wall wash, rows, shoulder EXT/IR/ER with bands    Consulted and Agree with Plan of Care Patient           Patient will benefit from skilled therapeutic intervention in order to improve the following deficits and impairments:  Decreased strength, Impaired UE functional use, Pain, Decreased range of motion, Decreased activity tolerance  Visit Diagnosis: Left shoulder pain, unspecified chronicity  Stiffness of left shoulder, not elsewhere classified     Problem List Patient Active Problem List   Diagnosis Date Noted  . Class 2 obesity due to excess calories with body mass index (BMI) of 38.0 to 38.9 in adult 05/28/2019  . Back pain 01/23/2019  . Family history of type 2 diabetes mellitus in mother 01/15/2019  . Family history of Alzheimer's disease- mother passed age 49 01/15/2019  . Psychophysiological insomnia 01/15/2019  . Adjustment disorder with mixed anxiety and depressed mood 01/15/2019  . Morbid obesity (HCC) 08/07/2018  . Localized swelling of right foot 08/07/2018  . Noncompliance with diet and medication regimen 08/07/2018  .  High risk for OSA-  declines referral 08/07/2018  . Elevated blood pressure reading  02/05/2018  . Prediabetes 02/05/2018  . Family history of thyroid disease 02/05/2018  . Environmental and seasonal allergies 02/05/2018  . Venous insufficiency (chronic) (peripheral)- R >etr L LExt 02/05/2018  . Vitamin D insufficiency 02/05/2018     04/07/2018, PT, DPT 05/21/20 3:01 PM  [facility] Outpatient Rehabilitation [facility] [address] [place], [place], [place] Phone: [phone]   Fax:  [phone]  Name: Makayla Matthews MRN: [id number] Combs Date of Birth: Mar 24, 1966

## 2020-05-26 ENCOUNTER — Ambulatory Visit: Payer: BC Managed Care – PPO

## 2020-05-27 ENCOUNTER — Ambulatory Visit: Payer: BC Managed Care – PPO

## 2020-05-27 ENCOUNTER — Other Ambulatory Visit: Payer: Self-pay

## 2020-05-27 DIAGNOSIS — M25512 Pain in left shoulder: Secondary | ICD-10-CM | POA: Diagnosis not present

## 2020-05-27 DIAGNOSIS — M25612 Stiffness of left shoulder, not elsewhere classified: Secondary | ICD-10-CM

## 2020-05-27 NOTE — Therapy (Signed)
[place], [place], [address] Phone: [phone]   Fax:  [phone]  Physical Therapy Treatment  Patient Details  Name: Makayla Matthews MRN: [id number] Date of Birth: 28-Jun-1966 Referring Provider (PT): Dorcas Mcmurray, MD   Encounter Date: 05/27/2020   PT End of Session - 05/27/20 1048    Visit Number 5    Number of Visits 12    Date for PT Re-Evaluation 06/12/20    Authorization Type BCBS - FOTO at visit 6 and visit 10    PT Start Time 1042    PT Stop Time 1117    PT Time Calculation (min) 35 min    Activity Tolerance Patient tolerated treatment well    Behavior During Therapy [facility] for tasks assessed/performed           Past Medical History:  Diagnosis Date  . Anxiety   . Back pain   . Depression   . Pre-diabetes   . Vitamin D deficiency     Past Surgical History:  Procedure Laterality Date  . CESAREAN SECTION  2004    There were no vitals filed for this visit.       [facility] PT Assessment - 05/27/20 0001      Assessment   Medical Diagnosis Lt shoulder pain    Referring Provider (PT) Dorcas Mcmurray, MD      AROM   Left Shoulder Flexion 135 Degrees    Left Shoulder ABduction 112 Degrees   L upper trap compensation   Left Shoulder Internal Rotation --   T5   Left Shoulder External Rotation --   T2   Left Shoulder Horizontal ABduction 20 Degrees    Left Shoulder Horizontal ADduction 100 Degrees                         [facility] Adult PT Treatment/Exercise - 05/27/20 0001      Self-Care   Self-Care Other Self-Care Comments    Other Self-Care Comments  Reviewed HEP. Discussed TPDN for L deltoid next session potentially and handout/information provided to pt.      Shoulder Exercises: Supine   Flexion 20 reps    Flexion Limitations with 5# on dowel      Shoulder Exercises: Prone   Other Prone Exercises Quadruped IYT 5x each UE      Shoulder Exercises: Standing   External  Rotation Strengthening;Left;Theraband;20 reps    Theraband Level (Shoulder External Rotation) Level 3 (Green)    Internal Rotation Strengthening;Left;20 reps;Theraband    Theraband Level (Shoulder Internal Rotation) Level 3 (Green)    Extension Strengthening;Both;20 reps;Theraband    Theraband Level (Shoulder Extension) Level 4 (Blue)    Row Strengthening;Both;20 reps;Theraband    Theraband Level (Shoulder Row) Level 4 (Blue)      Shoulder Exercises: ROM/Strengthening   UBE (Upper Arm Bike) L4 3 min forward, 3 min backward      Manual Therapy   Manual Therapy Soft tissue mobilization;Myofascial release    Soft tissue mobilization STM and myofascial release along L deltoid                    PT Short Term Goals - 05/27/20 1128      PT SHORT TERM GOAL #1   Title Review FOTO score    Time 1    Period Weeks    Status Achieved      PT SHORT TERM GOAL #2   Title Independent  with initial hEP    Time 3    Period Weeks    Status Achieved      PT SHORT TERM GOAL #3   Title Improved AROM to 125 deg flexion and abduction    Baseline Update 05/27/2020: 135 deg FL, 112 deg ABD (L upper trap compensation)    Time 3    Period Weeks    Status Partially Met             PT Long Term Goals - 05/05/20 1659      PT LONG TERM GOAL #1   Title She will be indpendent with all HEP issued.    Time 6    Period Weeks    Status New      PT LONG TERM GOAL #2   Title She will be able to lift 3 # overhead  with min pain    Time 6    Period Weeks    Status New      PT LONG TERM GOAL #3   Title FOTO scor e  improved  to 68%    Time 6    Period Weeks    Status New      PT LONG TERM GOAL #4   Title AROM LT shoulder equal RT.    Time 6    Period Weeks    Status New      PT LONG TERM GOAL #5   Title Able to do self hair care with min to no pain    Time 6    Period Weeks    Status New                 Plan - 05/27/20 1048    Clinical Impression Statement Pt  tolerated treatment session well with complaints of some lateral shoulder pain during supine flexion with dowel that was eased following STM and myofascial release of L deltoid. Patient demonstrates improvement with active L shoulder flexion to 135 degrees and abduction to 112 degrees (L upper trap compensation during abduction). PT and pt discussed potential TPDN of L deltoid for next session if pt is interested.    Personal Factors and Comorbidities Time since onset of injury/illness/exacerbation;Fitness    Examination-Activity Limitations Bed Mobility;Reach Overhead;Lift;Carry;Bathing;Sleep    Examination-Participation Restrictions Cleaning;Occupation;Meal Prep;Laundry    Stability/Clinical Decision Making Stable/Uncomplicated    PT Frequency 2x / week    PT Duration 6 weeks    PT Treatment/Interventions Taping;Passive range of motion;Dry needling;Manual techniques;Therapeutic exercise;Patient/family education;Ultrasound;Cryotherapy;Electrical Stimulation;Iontophoresis 62m/ml Dexamethasone;Moist Heat    PT Next Visit Plan Review HEP and progress PRN. Potential TPDN? Manual for shoulder mobility and pain. Continue light stretching and light periscapular and rotator cuff strengthening as tolerated.    PT Home Exercise Plan [id number]: Shoulder blade squeezes, options for shoulder elevation stretching (supine with dowel, seated/standing table slide, step back stretch), doorway shoulder ER stretch, serratus punch, wall wash, rows, shoulder EXT/IR/ER with bands    Consulted and Agree with Plan of Care Patient           Patient will benefit from skilled therapeutic intervention in order to improve the following deficits and impairments:  Decreased strength, Impaired UE functional use, Pain, Decreased range of motion, Decreased activity tolerance  Visit Diagnosis: Left shoulder pain, unspecified chronicity  Stiffness of left shoulder, not elsewhere classified     Problem List Patient Active  Problem List   Diagnosis Date Noted  . Class 2 obesity due to excess calories with body mass  index (BMI) of 38.0 to 38.9 in adult 05/28/2019  . Back pain 01/23/2019  . Family history of type 2 diabetes mellitus in mother 01/15/2019  . Family history of Alzheimer's disease- mother passed age 74 01/15/2019  . Psychophysiological insomnia 01/15/2019  . Adjustment disorder with mixed anxiety and depressed mood 01/15/2019  . Morbid obesity ([place]) 08/07/2018  . Localized swelling of right foot 08/07/2018  . Noncompliance with diet and medication regimen 08/07/2018  .  High risk for OSA-  declines referral 08/07/2018  . Elevated blood pressure reading 02/05/2018  . Prediabetes 02/05/2018  . Family history of thyroid disease 02/05/2018  . Environmental and seasonal allergies 02/05/2018  . Venous insufficiency (chronic) (peripheral)- R >etr L LExt 02/05/2018  . Vitamin D insufficiency 02/05/2018    Haydee Monica, PT, DPT 05/27/20 11:42 AM  [facility] [address] [place], [place], [address] Phone: [phone]   Fax:  [phone]  Name: Makayla Matthews MRN: [id number] Date of Birth: [birth year]

## 2020-06-02 ENCOUNTER — Ambulatory Visit: Payer: BC Managed Care – PPO

## 2020-06-02 ENCOUNTER — Other Ambulatory Visit: Payer: Self-pay

## 2020-06-02 DIAGNOSIS — M25612 Stiffness of left shoulder, not elsewhere classified: Secondary | ICD-10-CM

## 2020-06-02 DIAGNOSIS — M25512 Pain in left shoulder: Secondary | ICD-10-CM | POA: Diagnosis not present

## 2020-06-02 NOTE — Therapy (Addendum)
[place], [place], [address] Phone: [phone]   Fax:  [phone]  Physical Therapy Treatment  Patient Details  Name: Makayla Matthews MRN: [id number] Date of Birth: December 26, 1965 Referring Provider (PT): Dorcas Mcmurray, MD   Encounter Date: 06/02/2020   PT End of Session - 06/02/20 1423    Visit Number 6    Number of Visits 12    Date for PT Re-Evaluation 06/12/20    Authorization Type BCBS - FOTO at visit 10    PT Start Time 1400    PT Stop Time 1438    PT Time Calculation (min) 38 min    Activity Tolerance Patient tolerated treatment well    Behavior During Therapy [facility] for tasks assessed/performed           Past Medical History:  Diagnosis Date  . Anxiety   . Back pain   . Depression   . Pre-diabetes   . Vitamin D deficiency     Past Surgical History:  Procedure Laterality Date  . CESAREAN SECTION  2004    There were no vitals filed for this visit.   Subjective Assessment - 06/02/20 1413    Subjective Patient complains of some soreness in L deltoid but not really any pain otherwise.    Limitations Lifting    Diagnostic tests [place]    Patient Stated Goals She wants to improve strength to help kids    Currently in Pain? No/denies    Pain Score 0-No pain    Pain Location Shoulder    Pain Orientation Left;Lateral    Pain Onset More than a month ago              [facility] PT Assessment - 06/02/20 0001      Assessment   Medical Diagnosis Lt shoulder pain    Referring Provider (PT) Dorcas Mcmurray, MD      Observation/Other Assessments   Focus on Therapeutic Outcomes (FOTO)  44% limited; predicted 32% limitation                         [facility] Adult PT Treatment/Exercise - 06/02/20 0001      Shoulder Exercises: Prone   Other Prone Exercises Quadruped IYT 5x each direction with 1# in BUE      Shoulder Exercises: Standing   External Rotation Strengthening;Left;Theraband;20 reps     Theraband Level (Shoulder External Rotation) Level 4 (Blue)    Internal Rotation Strengthening;Left;20 reps;Theraband    Theraband Level (Shoulder Internal Rotation) Other (comment)   [place]   Extension Strengthening;Both;20 reps;Theraband    Theraband Level (Shoulder Extension) Other (comment)   [place]   Row Strengthening;Both;20 reps;Theraband    Theraband Level (Shoulder Row) Other (comment)   [place]   Other Standing Exercises Push up plus at free motion (plantigrade scapular protraction) x 20    Other Standing Exercises Ys at wall x 15 BUE      Shoulder Exercises: ROM/Strengthening   UBE (Upper Arm Bike) L4 3 min forward, 3 min backward      Manual Therapy   Manual therapy comments Skilled observation, palpation, and inspection during TPDN of L middle deltoid by [place].    Soft tissue mobilization Gentle STM of L deltoid following TPDN.            Trigger Point Dry Needling - 06/02/20 0001    Consent Given? Yes    Education Handout Provided Previously provided  handout and verbal education provided   Muscles Treated Upper Quadrant Deltoid   L middle deltoid   Deltoid Response Twitch response elicited                PT Education - 06/02/20 1447    Education Details Patient education regarding updated HEP (addition of Ys at wall and quadruped IYT) and TPDN of L middle deltoid and expected response. Reviewed updated FOTO score and progress thus far.    Person(s) Educated Patient    Methods Explanation;Demonstration    Comprehension Verbalized understanding;Returned demonstration            PT Short Term Goals - 05/27/20 1128      PT SHORT TERM GOAL #1   Title Review FOTO score    Time 1    Period Weeks    Status Achieved      PT SHORT TERM GOAL #2   Title Independent with initial hEP    Time 3    Period Weeks    Status Achieved      PT SHORT TERM GOAL #3   Title Improved AROM to 125 deg flexion and abduction    Baseline Update 05/27/2020: 135  deg FL, 112 deg ABD (L upper trap compensation)    Time 3    Period Weeks    Status Partially Met             PT Long Term Goals - 05/05/20 1659      PT LONG TERM GOAL #1   Title She will be indpendent with all HEP issued.    Time 6    Period Weeks    Status New      PT LONG TERM GOAL #2   Title She will be able to lift 3 # overhead  with min pain    Time 6    Period Weeks    Status New      PT LONG TERM GOAL #3   Title FOTO scor e  improved  to 68%    Time 6    Period Weeks    Status New      PT LONG TERM GOAL #4   Title AROM LT shoulder equal RT.    Time 6    Period Weeks    Status New      PT LONG TERM GOAL #5   Title Able to do self hair care with min to no pain    Time 6    Period Weeks    Status New                 Plan - 06/02/20 1442    Clinical Impression Statement Patient tolerated treatment session well with no complaints of increase in pain. Pt tolerated TPDN to L middle deltoid well with no adverse effects noted. Patient's updated FOTO score reveals improvement from 53% (05/05/2020) to 44% limitation with a predicted 32% limitation.    Personal Factors and Comorbidities Time since onset of injury/illness/exacerbation;Fitness    Examination-Activity Limitations Bed Mobility;Reach Overhead;Lift;Carry;Bathing;Sleep    Examination-Participation Restrictions Cleaning;Occupation;Meal Prep;Laundry    Stability/Clinical Decision Making Stable/Uncomplicated    PT Frequency 2x / week    PT Duration 6 weeks    PT Treatment/Interventions Taping;Passive range of motion;Dry needling;Manual techniques;Therapeutic exercise;Patient/family education;Ultrasound;Cryotherapy;Electrical Stimulation;Iontophoresis 1m/ml Dexamethasone;Moist Heat    PT Next Visit Plan Re-evaluation next session. Assess response to TPDN of L middle deltoid. Review HEP and progress PRN. Manual for shoulder mobility and pain PRN.  Continue stretching and periscapular and rotator cuff  strengthening as tolerated.    PT Home Exercise Plan [id number]: Shoulder blade squeezes, options for shoulder elevation stretching (supine with dowel, seated/standing table slide, step back stretch), doorway shoulder ER stretch, serratus punch, wall wash, rows, shoulder EXT/IR/ER with bands, Ys at wall, quadruped IYT    Consulted and Agree with Plan of Care Patient           Patient will benefit from skilled therapeutic intervention in order to improve the following deficits and impairments:  Decreased strength, Impaired UE functional use, Pain, Decreased range of motion, Decreased activity tolerance  Visit Diagnosis: Left shoulder pain, unspecified chronicity  Stiffness of left shoulder, not elsewhere classified     Problem List Patient Active Problem List   Diagnosis Date Noted  . Class 2 obesity due to excess calories with body mass index (BMI) of 38.0 to 38.9 in adult 05/28/2019  . Back pain 01/23/2019  . Family history of type 2 diabetes mellitus in mother 01/15/2019  . Family history of Alzheimer's disease- mother passed age 108 01/15/2019  . Psychophysiological insomnia 01/15/2019  . Adjustment disorder with mixed anxiety and depressed mood 01/15/2019  . Morbid obesity ([place]) 08/07/2018  . Localized swelling of right foot 08/07/2018  . Noncompliance with diet and medication regimen 08/07/2018  .  High risk for OSA-  declines referral 08/07/2018  . Elevated blood pressure reading 02/05/2018  . Prediabetes 02/05/2018  . Family history of thyroid disease 02/05/2018  . Environmental and seasonal allergies 02/05/2018  . Venous insufficiency (chronic) (peripheral)- R >etr L LExt 02/05/2018  . Vitamin D insufficiency 02/05/2018     Haydee Monica, PT, DPT 06/02/20 4:13 PM  [facility] [address] [place], [place], [address] Phone: [phone]   Fax:  [phone]  Name: DAJANA GEHRIG MRN: [id number] Date of Birth:  19-Sep-1965

## 2020-06-04 ENCOUNTER — Ambulatory Visit: Payer: BC Managed Care – PPO | Attending: Family Medicine

## 2020-06-04 ENCOUNTER — Other Ambulatory Visit: Payer: Self-pay

## 2020-06-04 DIAGNOSIS — M25512 Pain in left shoulder: Secondary | ICD-10-CM

## 2020-06-04 DIAGNOSIS — M25612 Stiffness of left shoulder, not elsewhere classified: Secondary | ICD-10-CM

## 2020-06-04 NOTE — Therapy (Addendum)
[place], [place], [address] Phone: [phone]   Fax:  [phone]  Physical Therapy Treatment/Re-evaluation   Patient Details  Name: Makayla Matthews MRN: [id number] Date of Birth: 03-Oct-1965 Referring Provider (PT): Dorcas Mcmurray, MD   Encounter Date: 06/04/2020   PT End of Session - 06/04/20 1404    Visit Number 7    Number of Visits 19    Date for PT Re-Evaluation 07/18/20    Authorization Type [employer] - FOTO at visit 10    PT Start Time 1403   pt arrived late   PT Stop Time 1445    PT Time Calculation (min) 42 min    Activity Tolerance Patient tolerated treatment well    Behavior During Therapy [facility] for tasks assessed/performed           Past Medical History:  Diagnosis Date  . Anxiety   . Back pain   . Depression   . Pre-diabetes   . Vitamin D deficiency     Past Surgical History:  Procedure Laterality Date  . CESAREAN SECTION  2004    There were no vitals filed for this visit.   Subjective Assessment - 06/04/20 1402    Subjective Patient reports some soreness after TPDN of L middle deltoid that eased after 1-2 days. She states she continues to have some pain in L deltoid with elevation that is not as intense. She expresses wanting to try TPDN again potentially next session due to positive response after last session.    Limitations Lifting    Diagnostic tests [place]    Patient Stated Goals She wants to improve strength to help kids    Currently in Pain? No/denies    Pain Score 0-No pain    Pain Location Shoulder    Pain Orientation Left;Lateral    Pain Onset More than a month ago              [facility] PT Assessment - 06/04/20 0001      Assessment   Medical Diagnosis Lt shoulder pain    Referring Provider (PT) Dorcas Mcmurray, MD      Observation/Other Assessments   Focus on Therapeutic Outcomes (FOTO)  06/02/2020: 44% limited; predicted 32% limitation      ROM / Strength   AROM / PROM /  Strength Strength;AROM      AROM   Right/Left Shoulder Right;Left    Right Shoulder Flexion 159 Degrees    Right Shoulder ABduction 120 Degrees    Right Shoulder Internal Rotation --   T6   Right Shoulder External Rotation --   T2   Left Shoulder Flexion 139 Degrees    Left Shoulder ABduction 115 Degrees    Left Shoulder Internal Rotation --   T5   Left Shoulder External Rotation --   T2   Left Shoulder Horizontal ABduction 42 Degrees    Left Shoulder Horizontal ADduction 100 Degrees      PROM   Left Shoulder Flexion 158 Degrees    Left Shoulder ABduction 119 Degrees    Left Shoulder External Rotation 50 Degrees   pain in L lateral arm at 50 deg;measured in scaption     Strength   Strength Assessment Site Shoulder    Right/Left Shoulder Left;Right    Right Shoulder Flexion 4-/5    Right Shoulder Extension 4-/5    Right Shoulder ABduction 4-/5    Right Shoulder Internal Rotation 4/5    Right Shoulder External Rotation  4/5    Left Shoulder Flexion 3+/5    Left Shoulder Extension 3+/5    Left Shoulder ABduction 3+/5    Left Shoulder Internal Rotation 4/5    Left Shoulder External Rotation 3+/5                         [facility] Adult PT Treatment/Exercise - 06/04/20 0001      Shoulder Exercises: Standing   Other Standing Exercises Push up plus at free motion (plantigrade scapular protraction) x 20    Other Standing Exercises Standing at sink/counter and placing 3# dumbbell on top shelf in cabinet while PT performed L scapular upward rotation x 20      Shoulder Exercises: ROM/Strengthening   UBE (Upper Arm Bike) L4.5 x 3 min forward and 1.5 min backward; L3.5 min x 1.5 min backward    [place] L shoulder ABD with L scapular upward rotation by PT x 20      Manual Therapy   Manual Therapy Passive ROM    Passive ROM PROM of L shoulder in all planes                  PT Education - 06/04/20 1725    Education Details Reviewed and updated HEP. Discussed pt  interest in potentially having TPDN performed again next session in L middle deltoid.    Person(s) Educated Patient    Methods Explanation;Demonstration    Comprehension Verbalized understanding;Returned demonstration            PT Short Term Goals - 05/27/20 1128      PT SHORT TERM GOAL #1   Title Review FOTO score    Time 1    Period Weeks    Status Achieved      PT SHORT TERM GOAL #2   Title Independent with initial hEP    Time 3    Period Weeks    Status Achieved      PT SHORT TERM GOAL #3   Title Improved AROM to 125 deg flexion and abduction    Baseline Update 05/27/2020: 135 deg FL, 112 deg ABD (L upper trap compensation)    Time 3    Period Weeks    Status Partially Met             PT Long Term Goals - 06/04/20 1412      PT LONG TERM GOAL #1   Title She will be indpendent with all HEP issued.    Time 6    Period Weeks    Status On-going      PT LONG TERM GOAL #2   Title She will be able to lift 3 # overhead  with min pain    Baseline Update 06/04/2020: Pain at 120 deg flexion with 3# dumbbell that eased with increased FL    Time 6    Period Weeks    Status On-going      PT LONG TERM GOAL #3   Title FOTO score  improved  to 68%    Baseline Updated FOTO score 56% function on 06/02/2020    Time 6    Period Weeks    Status On-going      PT LONG TERM GOAL #4   Title AROM LT shoulder equal RT.    Baseline Updated 06/04/2020: Limited L shoulder FL and ABD primarily    Time 6    Period Weeks    Status On-going  PT LONG TERM GOAL #5   Title Able to do self hair care with min to no pain    Baseline Updated 06/04/2020: Pt verbalizes being able to groom hair with minimal to no pain. Still has difficulty some days but has been able to do for the most part.    Time 6    Period Weeks    Status Achieved      Additional Long Term Goals   Additional Long Term Goals Yes      PT LONG TERM GOAL #6   Title Patient will be able to place 2-3# bowls in high  cabinets at home without complaints of pain.    Baseline Pt has difficulty and L lateral arm (deltoid) pain while putting bowls/plates in high cabinets.    Time 6    Period Weeks    Status New    Target Date 07/16/20      PT LONG TERM GOAL #7   Title Patient will be able to perform L shoulder active flexion and abduction without painful arc of motion.    Baseline Pt currently has onset of pain ~120 deg elevation that eases at end range.    Time 6    Period Weeks    Status New    Target Date 07/16/20      PT LONG TERM GOAL #8   Title Patient will improve L shoulder MMT grossly to at least 4+/5.    Baseline 3+/5 FL, ABD, and ER    Time 6    Period Weeks    Status New    Target Date 07/16/20                 Plan - 06/04/20 1510    Clinical Impression Statement Patient tolerated treatment session well with fatigue during inteventions but no complaints of significant increase in pain. Pt presents with L lateral arm pain during active L shoulder elevation (FL and ABD) that was no longer noted when she performed elevation with passive L scapular upward rotation performed by PT to promote improved scapulohumeral rhythm. Reassessment of L shoulder AROM/PROM shows improvement since evaluation and previous measurements but pt is still limited and continues to have painful arc of motion with elevation. Pt will benefit from continued skilled PT intervention to further progress AROM, BUE strength, and scapulohumeral rhythm to allow for improved tolerance with functional/household activities.    Personal Factors and Comorbidities Time since onset of injury/illness/exacerbation;Fitness    Examination-Activity Limitations Bed Mobility;Reach Overhead;Lift;Carry;Bathing;Sleep    Examination-Participation Restrictions Cleaning;Occupation;Meal Prep;Laundry    Stability/Clinical Decision Making Stable/Uncomplicated    PT Frequency 2x / week    PT Duration 6 weeks    PT Treatment/Interventions  Taping;Passive range of motion;Dry needling;Manual techniques;Therapeutic exercise;Patient/family education;Ultrasound;Cryotherapy;Electrical Stimulation;Iontophoresis 80m/ml Dexamethasone;Moist Heat    PT Next Visit Plan TPDN to L middle deltoid if pt still interested? Review HEP and progress PRN. Manual for shoulder mobility and pain PRN. Continue stretching and periscapular and rotator cuff strengthening as tolerated. AROM with scapular upward rotation assist. Practicing reaching into high cabinets.    PT Home Exercise Plan [id number]: Shoulder blade squeezes, options for shoulder elevation stretching (supine with dowel, seated/standing table slide, step back stretch), doorway shoulder ER stretch, serratus punch, wall wash, rows, shoulder EXT/IR/ER with bands, Ys at wall, quadruped IYT, active shoulder elevation and practicing placing bowls in high cabinets at home.    Consulted and Agree with Plan of Care Patient  Patient will benefit from skilled therapeutic intervention in order to improve the following deficits and impairments:  Decreased strength, Impaired UE functional use, Pain, Decreased range of motion, Decreased activity tolerance  Visit Diagnosis: Left shoulder pain, unspecified chronicity  Stiffness of left shoulder, not elsewhere classified     Problem List Patient Active Problem List   Diagnosis Date Noted  . Class 2 obesity due to excess calories with body mass index (BMI) of 38.0 to 38.9 in adult 05/28/2019  . Back pain 01/23/2019  . Family history of type 2 diabetes mellitus in mother 01/15/2019  . Family history of Alzheimer's disease- mother passed age 67 01/15/2019  . Psychophysiological insomnia 01/15/2019  . Adjustment disorder with mixed anxiety and depressed mood 01/15/2019  . Morbid obesity ([place]) 08/07/2018  . Localized swelling of right foot 08/07/2018  . Noncompliance with diet and medication regimen 08/07/2018  .  High risk for OSA-  declines  referral 08/07/2018  . Elevated blood pressure reading 02/05/2018  . Prediabetes 02/05/2018  . Family history of thyroid disease 02/05/2018  . Environmental and seasonal allergies 02/05/2018  . Venous insufficiency (chronic) (peripheral)- R >etr L LExt 02/05/2018  . Vitamin D insufficiency 02/05/2018       Haydee Monica, PT, DPT 06/04/20 5:35 PM  [place] [facility] [address] [place], [place], [address] Phone: [phone]   Fax:  [phone]  Name: ZONIA CAPLIN MRN: [id number] Date of Birth: [birth year]

## 2020-06-08 ENCOUNTER — Ambulatory Visit (INDEPENDENT_AMBULATORY_CARE_PROVIDER_SITE_OTHER): Payer: BC Managed Care – PPO | Admitting: Physician Assistant

## 2020-06-08 ENCOUNTER — Encounter: Payer: Self-pay | Admitting: Physician Assistant

## 2020-06-08 ENCOUNTER — Other Ambulatory Visit: Payer: Self-pay

## 2020-06-08 VITALS — BP 112/79 | HR 84 | Ht 66.5 in | Wt 234.0 lb

## 2020-06-08 DIAGNOSIS — E6609 Other obesity due to excess calories: Secondary | ICD-10-CM

## 2020-06-08 DIAGNOSIS — Z6837 Body mass index (BMI) 37.0-37.9, adult: Secondary | ICD-10-CM

## 2020-06-08 MED ORDER — PHENTERMINE HCL 37.5 MG PO TABS: 37.5000 mg | ORAL_TABLET | Freq: Every day | ORAL | 0 refills | Status: DC

## 2020-06-08 NOTE — Progress Notes (Signed)
Telehealth office visit note for Makayla Masker, PA-C- at Primary Care at [facility]   I connected with current patient today by telephone and verified that I am speaking with the correct person   . Location of the patient: Home . Location of the provider: Office - This visit type was conducted due to national recommendations for restrictions regarding the COVID-19 Pandemic (e.g. social distancing) in an effort to limit this patient's exposure and mitigate transmission in our community.    - No physical exam could be performed with this format, beyond that communicated to [place] by the patient/ family members as noted.   - Additionally my office staff/ schedulers were to discuss with the patient that there may be a monetary charge related to this service, depending on their medical insurance.  My understanding is that patient understood and consented to proceed.     _________________________________________________________________________________   History of Present Illness: Patient calls in to follow up on weight management. Patient tolerating medication without issues. Reports got off track with diet with Thanksgiving. Has made dietary changes which include incorporating breakfast, 5 small meals with intermittent snacks, fruits, vegetables and salads. Has decreased junk food. Continues to exercise with walking and is using the rubber bands given by PT for shoulder. Plans to join the [facility] after Christmas.      GAD 7 : Generalized Anxiety Score 10/08/2019 01/15/2019  Nervous, Anxious, on Edge 0 2  Control/stop worrying 1 3  Worry too much - different things 0 3  Trouble relaxing 0 2  Restless 0 0  Easily annoyed or irritable 2 3  Afraid - awful might happen 0 3  Total GAD 7 Score 3 16  Anxiety Difficulty Somewhat difficult -    Depression screen [facility] 2/9 06/08/2020 05/06/2020 04/08/2020 10/08/2019 05/15/2019  Decreased Interest 0 0 0 1 1  Down, Depressed, Hopeless 0 0 0 1 0  PHQ - 2  Score 0 0 0 2 1  Altered sleeping 0 0 0 0 0  Tired, decreased energy 0 0 1 0 1  Change in appetite 0 0 1 0 0  Feeling bad or failure about yourself  0 0 0 1 0  Trouble concentrating 0 0 0 0 0  Moving slowly or fidgety/restless 0 0 0 0 0  Suicidal thoughts 0 0 0 0 0  PHQ-9 Score 0 0 2 3 2   Difficult doing work/chores - - Not difficult at all Somewhat difficult Not difficult at all  Some recent data might be hidden      Impression and Recommendations:     1. Class 2 obesity due to excess calories without serious comorbidity with body mass index (BMI) of 37.0 to 37.9 in adult     Class 2 obesity due to excess calories without serious comorbidity with body mass index (BMI) of 37.0 to 37.9 in adult: -Patient has lost an additional 6 pounds for a total of 13 pounds since starting medication.  -PDMP reviewed, no aberrancies noted. Provided refill. -Recommend to continue with dietary changes and physical activity regimen.  -BP today is wnl. -Follow up in 4 weeks.   - As part of my medical decision making, I reviewed the following data within the electronic MEDICAL RECORD NUMBER History obtained from pt /family, CMA notes reviewed and incorporated if applicable, Labs reviewed, Radiograph/ tests reviewed if applicable and OV notes from prior OV's with me, as well as any other specialists she/he has seen since seeing me  last, were all reviewed and used in my medical decision making process today.    - Additionally, when appropriate, discussion had with patient regarding our treatment plan, and their biases/concerns about that plan were used in my medical decision making today.    - The patient agreed with the plan and demonstrated an understanding of the instructions.   No barriers to understanding were identified.     - The patient was advised to call back or seek an in-person evaluation if the symptoms worsen or if the condition fails to improve as anticipated.   Return in about 4 weeks  (around 07/06/2020) for [facility] management.    No orders of the defined types were placed in this encounter.   Meds ordered this encounter  Medications  . phentermine (ADIPEX-P) 37.5 MG tablet    Sig: Take 1 tablet (37.5 mg total) by mouth daily before breakfast.    Dispense:  30 tablet    Refill:  0    Medications Discontinued During This Encounter  Medication Reason  . phentermine (ADIPEX-P) 37.5 MG tablet Reorder       Time spent on visit including pre-visit chart review and post-visit care was 8 minutes.      The 21st Century Cures Act was signed into law in 2016 which includes the topic of electronic health records.  This provides immediate access to information in MyChart.  This includes consultation notes, operative notes, office notes, lab results and pathology reports.  If you have any questions about what you read please let us know at your next visit or call us at the office.  We are right here with you.   __________________________________________________________________________________     Patient Care Team    Relationship Specialty Notifications Start End  Makayla Matthews, [place] PCP - General   11/03/19   Arnette Felts, FNP  General Practice  08/09/18      -Vitals obtained; medications/ allergies reconciled;  personal medical, social, Sx etc.histories were updated by CMA, reviewed by me and are reflected in chart   Patient Active Problem List   Diagnosis Date Noted  . Class 2 obesity due to excess calories with body mass index (BMI) of 38.0 to 38.9 in adult 05/28/2019  . Back pain 01/23/2019  . Family history of type 2 diabetes mellitus in mother 01/15/2019  . Family history of Alzheimer's disease- mother passed age 19 01/15/2019  . Psychophysiological insomnia 01/15/2019  . Adjustment disorder with mixed anxiety and depressed mood 01/15/2019  . Morbid obesity ([facility]) 08/07/2018  . Localized swelling of right foot 08/07/2018  . Noncompliance with diet and  medication regimen 08/07/2018  .  High risk for OSA-  declines referral 08/07/2018  . Elevated blood pressure reading 02/05/2018  . Prediabetes 02/05/2018  . Family history of thyroid disease 02/05/2018  . Environmental and seasonal allergies 02/05/2018  . Venous insufficiency (chronic) (peripheral)- R >etr L LExt 02/05/2018  . Vitamin D insufficiency 02/05/2018     Current Meds  Medication Sig  . azelastine (ASTELIN) 0.1 % nasal spray 1 spray each nostril twice daily after sinus rinses  . Cholecalciferol (VITAMIN D3) 1.25 MG (50000 UT) CAPS Take 1 capsule by mouth once a week.  Marland Kitchen FLUoxetine (PROZAC) 40 MG capsule Take 1 capsule (40 mg total) by mouth daily.  Marland Kitchen levocetirizine (XYZAL) 5 MG tablet Take 1 tablet (5 mg total) by mouth every evening.  . meloxicam (MOBIC) 15 MG tablet Take 1 tablet (15 mg total) by mouth daily.  Marland Kitchen  Multiple Vitamin (MULTIVITAMIN WITH MINERALS) TABS tablet Take 1 tablet by mouth daily.  . phentermine (ADIPEX-P) 37.5 MG tablet Take 1 tablet (37.5 mg total) by mouth daily before breakfast.  . traZODone (DESYREL) 100 MG tablet Take 1 tablet (100 mg total) by mouth at bedtime as needed for sleep.  . [DISCONTINUED] phentermine (ADIPEX-P) 37.5 MG tablet Take 1 tablet (37.5 mg total) by mouth daily before breakfast.     Allergies:  Allergies  Allergen Reactions  . Codeine Itching     ROS:  See above HPI for pertinent positives and negatives   Objective:   Blood pressure 112/79, pulse 84, height 5' 6.5" (1.689 m), weight 234 lb (106.1 kg).  (if some vitals are omitted, this means that patient was UNABLE to obtain them. ) General: A & O * 3; sounds in no acute distress Respiratory: speaking in full sentences, no conversational dyspnea Psych: insight appears good, mood- appears full

## 2020-06-10 ENCOUNTER — Ambulatory Visit: Payer: BC Managed Care – PPO | Admitting: Family Medicine

## 2020-06-10 ENCOUNTER — Encounter: Payer: Self-pay | Admitting: Family Medicine

## 2020-06-10 ENCOUNTER — Other Ambulatory Visit: Payer: Self-pay

## 2020-06-10 VITALS — BP 141/76 | Ht 66.5 in | Wt 234.0 lb

## 2020-06-10 DIAGNOSIS — M7582 Other shoulder lesions, left shoulder: Secondary | ICD-10-CM

## 2020-06-10 NOTE — Progress Notes (Addendum)
   PCP: Mayer Masker, PA-C  Subjective:   HPI: Patient is a 54 y.o. female here for follow-up on rotator cuff tendinopathy.  She was seen initially here on 04/17/2020, at that time diagnosed with rotator cuff tendinopathy of both supraspinatus and subscapularis.  She followed up on 05/13/2020, at that time had not done much physical therapy but was having worsening pain so received a corticosteroid injection.  Since then, she has been regularly attending PT.  Today, she reports that she has had significant improvement in her pain.  She continues have a little bit of weakness but is having very minimal pain.  She is very pleased with how she is doing.  She is about to complete her course of physical therapy which she thinks has been extremely helpful.   Review of Systems:  Per HPI.   PMFSH, medications and smoking status reviewed.      Objective:  Physical Exam:  [facility] 04/17/2020 05/13/2020  Frequency of aerobic exercise (# of days/week) 3 3  Average time in minutes 30 30  Frequency of strengthening activities (# of days/week) 0 0     Gen: awake, alert, NAD, comfortable in exam room Pulm: breathing unlabored  Left Shoulder: -Inspection: no obvious deformity, atrophy, or asymmetry. No bruising. No swelling -Palpation: no TTP over [facility] joint or bicipital groove. -ROM: Full ROM in abduction, flexion, internal/external rotation both passively and actively  NV intact distally Normal scapular function observed. Special Tests:  - Impingement:  Mildly positive Hawkins, Neg neers, Neg empty can sign. - Supraspinatus: Negative empty can.  4+/5 strength with resisted flexion at 20 degrees - Infraspinatus/Teres Minor: 5/5 strength with ER - Subscapularis: 5/5 strength with IR - Biceps tendon: Neg Speeds, Neg Yerrgason's  - Labrum: Negative Obriens, good stability - No painful arc and no drop arm sign    Assessment & Plan:  1.  Left rotator cuff  tendinopathy  Patient with significant improvement in symptoms after corticosteroid injection and physical therapy.  We will plan to continue with physical therapy, and follow-up as needed based on symptoms.   Guy Sandifer, MD [facility] Fellow 06/10/2020 2:20 PM  Addendum:  Patient seen in the office by fellow.  His history, exam, plan of care were precepted with me.  Norton Blizzard MD Marrianne Mood

## 2020-06-15 ENCOUNTER — Other Ambulatory Visit: Payer: Self-pay

## 2020-06-15 ENCOUNTER — Ambulatory Visit: Payer: BC Managed Care – PPO

## 2020-06-15 DIAGNOSIS — M25512 Pain in left shoulder: Secondary | ICD-10-CM

## 2020-06-15 DIAGNOSIS — M25612 Stiffness of left shoulder, not elsewhere classified: Secondary | ICD-10-CM

## 2020-06-15 NOTE — Therapy (Signed)
[place], [place], [address] Phone: [phone]   Fax:  [phone]  Physical Therapy Treatment  Patient Details  Name: Makayla Matthews MRN: [id number]  Date of Birth: 28-Oct-1965 Referring Provider (PT): Dorcas Mcmurray, MD   Encounter Date: 06/15/2020   PT End of Session - 06/15/20 1538    Visit Number 8    Number of Visits 19    Date for PT Re-Evaluation 07/18/20    Authorization Type BCBS - FOTO at visit 10    PT Start Time 1533    PT Stop Time 1614    PT Time Calculation (min) 41 min    Activity Tolerance Patient tolerated treatment well    Behavior During Therapy [facility] for tasks assessed/performed           Past Medical History:  Diagnosis Date  . Anxiety   . Back pain   . Depression   . Pre-diabetes   . Vitamin D deficiency     Past Surgical History:  Procedure Laterality Date  . CESAREAN SECTION  2004    There were no vitals filed for this visit.   Subjective Assessment - 06/15/20 1835    Subjective Patient reports her L shoulder has been doing really well, and she has a noticeably difference in her motion compared to before. She expresses primarily having continued difficulty with L shoulder ER. She explains that she had a brief sharp pain in shoulder when reaching overhead today that did not linger, but it was not a pain she has felt since before beginning skilled PT. Pt expresses wanting to have TPDN again this session due to pain relief from having it done during a previous session.    Limitations Lifting    Diagnostic tests [place]    Patient Stated Goals She wants to improve strength to help kids    Currently in Pain? No/denies    Pain Score 0-No pain    Pain Onset More than a month ago              [facility] PT Assessment - 06/15/20 0001      Assessment   Medical Diagnosis Lt shoulder pain    Referring Provider (PT) Dorcas Mcmurray, MD                         [facility] Adult  PT Treatment/Exercise - 06/15/20 0001      Self-Care   Self-Care Other Self-Care Comments    Other Self-Care Comments  Reviewed HEP and expected response following TPDN of L middle deltoid      Shoulder Exercises: Standing   Flexion AROM;Strengthening;Left;20 reps;Weights    Shoulder Flexion Weight (lbs) 3    Flexion Limitations in scaption then 15x lifting up to top shelf in cabinet above sink      Shoulder Exercises: ROM/Strengthening   UBE (Upper Arm Bike) L3.5 x 3 min forward, 3 min backward    Ranger L shoulder FL and ABD x 10 each      Shoulder Exercises: Stretch   Corner Stretch 2 reps;30 seconds      Manual Therapy   Manual Therapy Passive ROM;Myofascial release    Manual therapy comments Skilled observation, palpation, and inspection during TPDN of L middle deltoid by [place].    Soft tissue mobilization STM and IASTM of L deltoid followed by L shoulder PROM in all planes  Trigger Point Dry Needling - 06/15/20 0001    Consent Given? Yes    Education Handout Provided Previously provided    Muscles Treated Upper Quadrant Deltoid   L middle deltoid   Deltoid Response Twitch response elicited                PT Education - 06/15/20 1834    Education Details Reviewed expected response following TPDN of L middle deltoid.    Person(s) Educated Patient    Methods Explanation    Comprehension Verbalized understanding            PT Short Term Goals - 05/27/20 1128      PT SHORT TERM GOAL #1   Title Review FOTO score    Time 1    Period Weeks    Status Achieved      PT SHORT TERM GOAL #2   Title Independent with initial hEP    Time 3    Period Weeks    Status Achieved      PT SHORT TERM GOAL #3   Title Improved AROM to 125 deg flexion and abduction    Baseline Update 05/27/2020: 135 deg FL, 112 deg ABD (L upper trap compensation)    Time 3    Period Weeks    Status Partially Met             PT Long Term Goals - 06/04/20  1412      PT LONG TERM GOAL #1   Title She will be indpendent with all HEP issued.    Time 6    Period Weeks    Status On-going      PT LONG TERM GOAL #2   Title She will be able to lift 3 # overhead  with min pain    Baseline Update 06/04/2020: Pain at 120 deg flexion with 3# dumbbell that eased with increased FL    Time 6    Period Weeks    Status On-going      PT LONG TERM GOAL #3   Title FOTO score  improved  to 68%    Baseline Updated FOTO score 56% function on 06/02/2020    Time 6    Period Weeks    Status On-going      PT LONG TERM GOAL #4   Title AROM LT shoulder equal RT.    Baseline Updated 06/04/2020: Limited L shoulder FL and ABD primarily    Time 6    Period Weeks    Status On-going      PT LONG TERM GOAL #5   Title Able to do self hair care with min to no pain    Baseline Updated 06/04/2020: Pt verbalizes being able to groom hair with minimal to no pain. Still has difficulty some days but has been able to do for the most part.    Time 6    Period Weeks    Status Achieved      Additional Long Term Goals   Additional Long Term Goals Yes      PT LONG TERM GOAL #6   Title Patient will be able to place 2-3# bowls in high cabinets at home without complaints of pain.    Baseline Pt has difficulty and L lateral arm (deltoid) pain while putting bowls/plates in high cabinets.    Time 6    Period Weeks    Status New    Target Date 07/16/20      PT LONG TERM  GOAL #7   Title Patient will be able to perform L shoulder active flexion and abduction without painful arc of motion.    Baseline Pt currently has onset of pain ~120 deg elevation that eases at end range.    Time 6    Period Weeks    Status New    Target Date 07/16/20      PT LONG TERM GOAL #8   Title Patient will improve L shoulder MMT grossly to at least 4+/5.    Baseline 3+/5 FL, ABD, and ER    Time 6    Period Weeks    Status New    Target Date 07/16/20                 Plan - 06/15/20  1831    Clinical Impression Statement Patient tolerated treatment session and TPDN to L middle deltoid well with no adverse effects or complaints of significant pain. She continues to demonstrate improved tolerance with shoulder AROM/PROM with pain primarily occuring during L shoulder ER. She will continue to benefit from skilled PT for improved tolerance with ER and with reaching into higher cabinets.    Personal Factors and Comorbidities Time since onset of injury/illness/exacerbation;Fitness    Examination-Activity Limitations Bed Mobility;Reach Overhead;Lift;Carry;Bathing;Sleep    Examination-Participation Restrictions Cleaning;Occupation;Meal Prep;Laundry    Stability/Clinical Decision Making Stable/Uncomplicated    PT Frequency 2x / week    PT Duration 6 weeks    PT Treatment/Interventions Taping;Passive range of motion;Dry needling;Manual techniques;Therapeutic exercise;Patient/family education;Ultrasound;Cryotherapy;Electrical Stimulation;Iontophoresis 37m/ml Dexamethasone;Moist Heat    PT Next Visit Plan Review HEP and progress PRN. Manual for shoulder mobility and pain PRN. Continue stretching and periscapular and rotator cuff strengthening as tolerated. AROM with scapular upward rotation assist. Practicing reaching into high cabinets.    PT Home Exercise Plan [id number]: Shoulder blade squeezes, options for shoulder elevation stretching (supine with dowel, seated/standing table slide, step back stretch), doorway shoulder ER stretch, serratus punch, wall wash, rows, shoulder EXT/IR/ER with bands, Ys at wall, quadruped IYT, active shoulder elevation and practicing placing bowls in high cabinets at home.    Consulted and Agree with Plan of Care Patient           Patient will benefit from skilled therapeutic intervention in order to improve the following deficits and impairments:  Decreased strength,Impaired UE functional use,Pain,Decreased range of motion,Decreased activity tolerance  Visit  Diagnosis: Left shoulder pain, unspecified chronicity  Stiffness of left shoulder, not elsewhere classified     Problem List Patient Active Problem List   Diagnosis Date Noted  . Class 2 obesity due to excess calories with body mass index (BMI) of 38.0 to 38.9 in adult 05/28/2019  . Back pain 01/23/2019  . Family history of type 2 diabetes mellitus in mother 01/15/2019  . Family history of Alzheimer's disease- mother passed age 325407/14/2020  . Psychophysiological insomnia 01/15/2019  . Adjustment disorder with mixed anxiety and depressed mood 01/15/2019  . Morbid obesity ([place] 08/07/2018  . Localized swelling of right foot 08/07/2018  . Noncompliance with diet and medication regimen 08/07/2018  .  High risk for OSA-  declines referral 08/07/2018  . Elevated blood pressure reading 02/05/2018  . Prediabetes 02/05/2018  . Family history of thyroid disease 02/05/2018  . Environmental and seasonal allergies 02/05/2018  . Venous insufficiency (chronic) (peripheral)- R >etr L LExt 02/05/2018  . Vitamin D insufficiency 02/05/2018     KHaydee Monica PT, DPT 06/15/20 6:41 PM  [facility] [address]  [place], [place], [address] Phone: [phone]   Fax:  [phone]  Name: Makayla Matthews MRN: [id number] Date of Birth: 05-Aug-1965

## 2020-06-18 ENCOUNTER — Ambulatory Visit: Payer: BC Managed Care – PPO

## 2020-06-22 ENCOUNTER — Other Ambulatory Visit: Payer: Self-pay

## 2020-06-22 ENCOUNTER — Ambulatory Visit: Payer: BC Managed Care – PPO

## 2020-06-22 DIAGNOSIS — M25612 Stiffness of left shoulder, not elsewhere classified: Secondary | ICD-10-CM

## 2020-06-22 DIAGNOSIS — M25512 Pain in left shoulder: Secondary | ICD-10-CM | POA: Diagnosis not present

## 2020-06-22 NOTE — Therapy (Signed)
[facility] [address] [place], [place], [address] Phone: [phone]   Fax:  [phone]  Physical Therapy Treatment/Discharge Summary  Patient Details  Name: Makayla Matthews MRN: [id number] Date of Birth: [birth year] Referring Provider (PT): Denny Levy, MD   Encounter Date: 06/22/2020   PT End of Session - 06/22/20 1531    Visit Number 9    Number of Visits 19    Date for PT Re-Evaluation 07/18/20    Authorization Type BCBS - FOTO at visit 10    PT Start Time 1531    PT Stop Time 1611    PT Time Calculation (min) 40 min    Activity Tolerance Patient tolerated treatment well    Behavior During Therapy [facility] for tasks assessed/performed           Past Medical History:  Diagnosis Date  . Anxiety   . Back pain   . Depression   . Pre-diabetes   . Vitamin D deficiency     Past Surgical History:  Procedure Laterality Date  . CESAREAN SECTION  2004    There were no vitals filed for this visit.   Subjective Assessment - 06/22/20 1924    Subjective Patient reports continued progress and pain relief in L shoulder with interventions and after TPDN previous session. She explains that she has been able to perform all desired activities with only minor discomfort occasionally but no significant pain or limitation. She expresses being pleased with her progress and not having any particular tasks she continues to have difficulty completing at home/work.    Limitations Lifting    Diagnostic tests [place]    Patient Stated Goals She wants to improve strength to help kids    Currently in Pain? No/denies    Pain Score 0-No pain    Pain Onset More than a month ago              [facility] PT Assessment - 06/22/20 0001      Assessment   Medical Diagnosis Lt shoulder pain    Referring Provider (PT) Denny Levy, MD      Observation/Other Assessments   Focus on Therapeutic Outcomes (FOTO)  22% limited; predicted 32% limitation      AROM    Right/Left Shoulder Right;Left    Right Shoulder Flexion 154 Degrees    Right Shoulder ABduction 132 Degrees    Right Shoulder Internal Rotation --   T7   Right Shoulder External Rotation --   T2   Right Shoulder Horizontal ABduction 45 Degrees    Right Shoulder Horizontal  ADduction 110 Degrees    Left Shoulder Flexion 150 Degrees    Left Shoulder ABduction 130 Degrees    Left Shoulder Internal Rotation --   T4   Left Shoulder External Rotation --   T2   Left Shoulder Horizontal ABduction 45 Degrees    Left Shoulder Horizontal ADduction 105 Degrees      Strength   Overall Strength Comments BUE 4+/5 grossly    Strength Assessment Site Shoulder    Right/Left Shoulder Left;Right    Right Shoulder Flexion 4+/5    Right Shoulder Extension 4+/5    Right Shoulder ABduction 4+/5    Right Shoulder Internal Rotation 4+/5    Right Shoulder External Rotation 4+/5    Left Shoulder Flexion 4+/5    Left Shoulder Extension 4+/5    Left Shoulder ABduction 4+/5    Left Shoulder Internal Rotation 4+/5    Left Shoulder  External Rotation 4+/5                         [facility] Adult PT Treatment/Exercise - 06/22/20 0001      Self-Care   Self-Care Other Self-Care Comments    Other Self-Care Comments  Reviewed STG and LTG. Reviewed and updated HEP, provided pt with Myles therabands for independence with interventions following D/C, discussed consistent stretching and strengthening to maintain progress, reviewed FOTO, and discussed D/C this session. PT informed pt she can call her doctor and obtain new referral if she requires skilled PT services in the future.      Shoulder Exercises: Standing   Flexion Limitations FL, scaption, and ABD AROM without painful arc of motion or limitation    Extension Strengthening;Both;20 reps;Theraband    Theraband Level (Shoulder Extension) Other (comment)   Krebbs   Row Strengthening;Both;20 reps;Theraband    Theraband Level (Shoulder Row) Other  (comment)   Bray   Other Standing Exercises Push up plus at free motion (plantigrade scapular protraction) x 20    Other Standing Exercises Demonstrated and had pt return demonstration of resisted L shoulder ER and IR at 90/90 degrees as a progression at home once tolerated      Shoulder Exercises: ROM/Strengthening   UBE (Upper Arm Bike) L4.5 x 6 min (3 min forward, 3 min backward)      Manual Therapy   Manual Therapy Passive ROM    Passive ROM Assessed PROM in all planes with no complaints of pain throughout motions                  PT Education - 06/22/20 1926    Education Details Reviewed and updated HEP, provided pt with [place] therabands for independence with interventions following D/C, discussed consistent stretching and strengthening to maintain progress, reviewed FOTO, and discussed D/C this session. PT informed pt she can call her doctor and obtain new referral if she requires skilled PT services in the future.    Person(s) Educated Patient    Methods Explanation;Demonstration    Comprehension Verbalized understanding;Returned demonstration            PT Short Term Goals - 06/22/20 1936      PT SHORT TERM GOAL #1   Title Review FOTO score    Time 1    Period Weeks    Status Achieved      PT SHORT TERM GOAL #2   Title Independent with initial hEP    Time 3    Period Weeks    Status Achieved      PT SHORT TERM GOAL #3   Title Improved AROM to 125 deg flexion and abduction    Baseline Update 05/27/2020: 135 deg FL, 112 deg ABD (L upper trap compensation)    Time 3    Period Weeks    Status Achieved             PT Long Term Goals - 06/22/20 1543      PT LONG TERM GOAL #1   Title She will be indpendent with all HEP issued.    Time 6    Period Weeks    Status Achieved      PT LONG TERM GOAL #2   Title She will be able to lift 3 # overhead  with min pain    Baseline Update 06/04/2020: Pain at 120 deg flexion with 3# dumbbell that eased with  increased FL  Time 6    Period Weeks    Status Achieved      PT LONG TERM GOAL #3   Title FOTO score  improved  to 68%    Baseline 06/22/2020: 22% limited; predicted 32% limitation    Time 6    Period Weeks    Status Achieved      PT LONG TERM GOAL #4   Title AROM LT shoulder equal RT.    Baseline Updated 06/04/2020: Limited L shoulder FL and ABD primarily. 06/22/2020: within 5 degrees of eachother    Time 6    Period Weeks    Status Partially Met      PT LONG TERM GOAL #5   Title Able to do self hair care with min to no pain    Baseline Updated 06/04/2020: Pt verbalizes being able to groom hair with minimal to no pain. Still has difficulty some days but has been able to do for the most part.    Time 6    Period Weeks    Status Achieved      PT LONG TERM GOAL #6   Title Patient will be able to place 2-3# bowls in high cabinets at home without complaints of pain.    Baseline Pt has difficulty and L lateral arm (deltoid) pain while putting bowls/plates in high cabinets.    Time 6    Period Weeks    Status Achieved      PT LONG TERM GOAL #7   Title Patient will be able to perform L shoulder active flexion and abduction without painful arc of motion.    Baseline Pt currently has onset of pain ~120 deg elevation that eases at end range.    Time 6    Period Weeks    Status Achieved      PT LONG TERM GOAL #8   Title Patient will improve L shoulder MMT grossly to at least 4+/5.    Baseline 3+/5 FL, ABD, and ER. 06/22/2020: 4+/5 gross L shoulder MMT    Time 6    Period Weeks    Status Achieved                 Plan - 06/22/20 1928    Clinical Impression Statement Patient tolerated treatment session well with no adverse effects or complaints of pain during any interventions. She demonstrates progress with L shoulder AROM and strength with no pain during elevation overhead or ER today. Pt has been reaching into her higher cabinets and threw a baseball back and forth for  at least 40 minutes within the past week without pain or limitation. She should maintain progress while consistently performing HEP independently. Pt D/C today due to meeting goals and being pleased with functional status.    Personal Factors and Comorbidities Time since onset of injury/illness/exacerbation;Fitness    Examination-Activity Limitations Bed Mobility;Reach Overhead;Lift;Carry;Bathing;Sleep    Examination-Participation Restrictions Cleaning;Occupation;Meal Prep;Laundry    Stability/Clinical Decision Making Stable/Uncomplicated    PT Frequency 2x / week    PT Duration 6 weeks    PT Treatment/Interventions Taping;Passive range of motion;Dry needling;Manual techniques;Therapeutic exercise;Patient/family education;Ultrasound;Cryotherapy;Electrical Stimulation;Iontophoresis 40m/ml Dexamethasone;Moist Heat    PT Next Visit Plan D/C today    PT Home Exercise Plan [id number]: Shoulder blade squeezes, options for shoulder elevation stretching (supine with dowel, step back stretch), doorway shoulder ER stretch, serratus punch, wall wash, rows, shoulder EXT/IR/ER with bands, Ys at wall, quadruped IYT, serratus push up plus, tricep stretch, corner stretch  Consulted and Agree with Plan of Care Patient           Patient will benefit from skilled therapeutic intervention in order to improve the following deficits and impairments:  Decreased strength,Impaired UE functional use,Pain,Decreased range of motion,Decreased activity tolerance  Visit Diagnosis: Left shoulder pain, unspecified chronicity  Stiffness of left shoulder, not elsewhere classified     Problem List Patient Active Problem List   Diagnosis Date Noted  . Class 2 obesity due to excess calories with body mass index (BMI) of 38.0 to 38.9 in adult 05/28/2019  . Back pain 01/23/2019  . Family history of type 2 diabetes mellitus in mother 01/15/2019  . Family history of Alzheimer's disease- mother passed age 39 01/15/2019  .  Psychophysiological insomnia 01/15/2019  . Adjustment disorder with mixed anxiety and depressed mood 01/15/2019  . Morbid obesity ([place]) 08/07/2018  . Localized swelling of right foot 08/07/2018  . Noncompliance with diet and medication regimen 08/07/2018  .  High risk for OSA-  declines referral 08/07/2018  . Elevated blood pressure reading 02/05/2018  . Prediabetes 02/05/2018  . Family history of thyroid disease 02/05/2018  . Environmental and seasonal allergies 02/05/2018  . Venous insufficiency (chronic) (peripheral)- R >etr L LExt 02/05/2018  . Vitamin D insufficiency 02/05/2018    PHYSICAL THERAPY DISCHARGE SUMMARY  Visits from Start of Care: 9  Current functional level related to goals / functional outcomes: See above   Remaining deficits: See above   Education / Equipment: See above Plan: Patient agrees to discharge.  Patient goals were met. Patient is being discharged due to meeting the stated rehab goals.  ?????          Haydee Monica, PT, DPT 06/22/20 7:44 PM  [facility] [address] [place], [place], [address] Phone: [phone]   Fax:  [phone]  Name: Makayla Matthews MRN: [id number] Date of Birth: 1966/04/15

## 2020-06-25 ENCOUNTER — Ambulatory Visit: Payer: BC Managed Care – PPO

## 2020-06-29 ENCOUNTER — Ambulatory Visit: Payer: BC Managed Care – PPO

## 2020-07-10 IMAGING — DX LUMBAR SPINE - COMPLETE 4+ VIEW
6 series · 6 of 6 positions shown · non-contrast
Comparison: None.

CLINICAL DATA: Acute left-sided low back pain without sciatica.

EXAM:
LUMBAR SPINE - COMPLETE 4+ VIEW

[lumbar spine ap (1 of 2)]
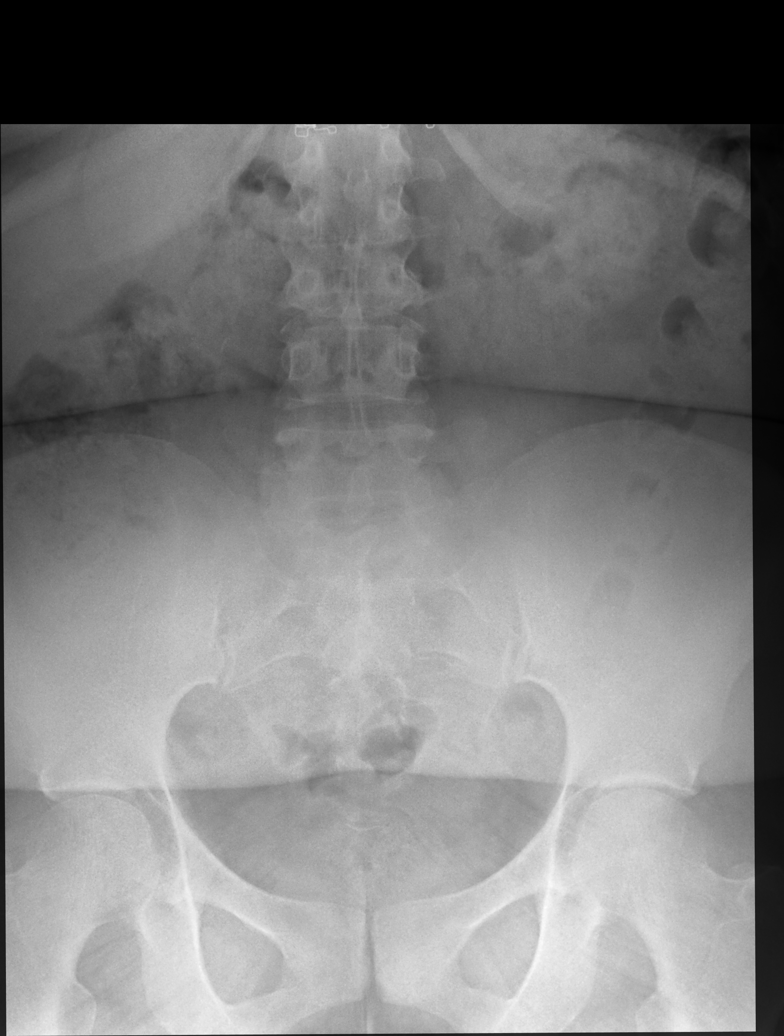

[lumbar spine ap (2 of 2)]
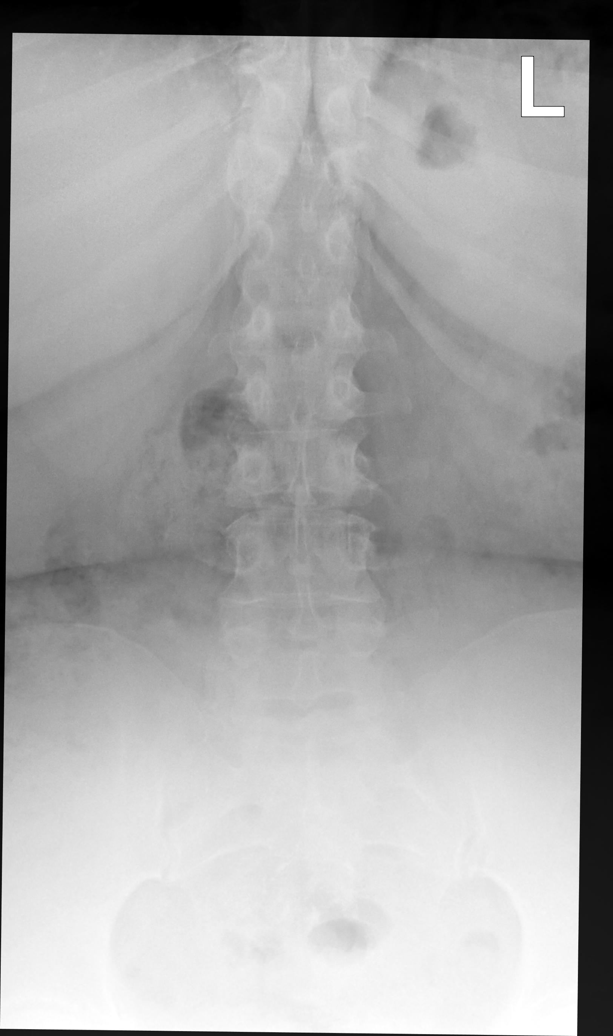

[lumbar spine lat (1 of 2)]
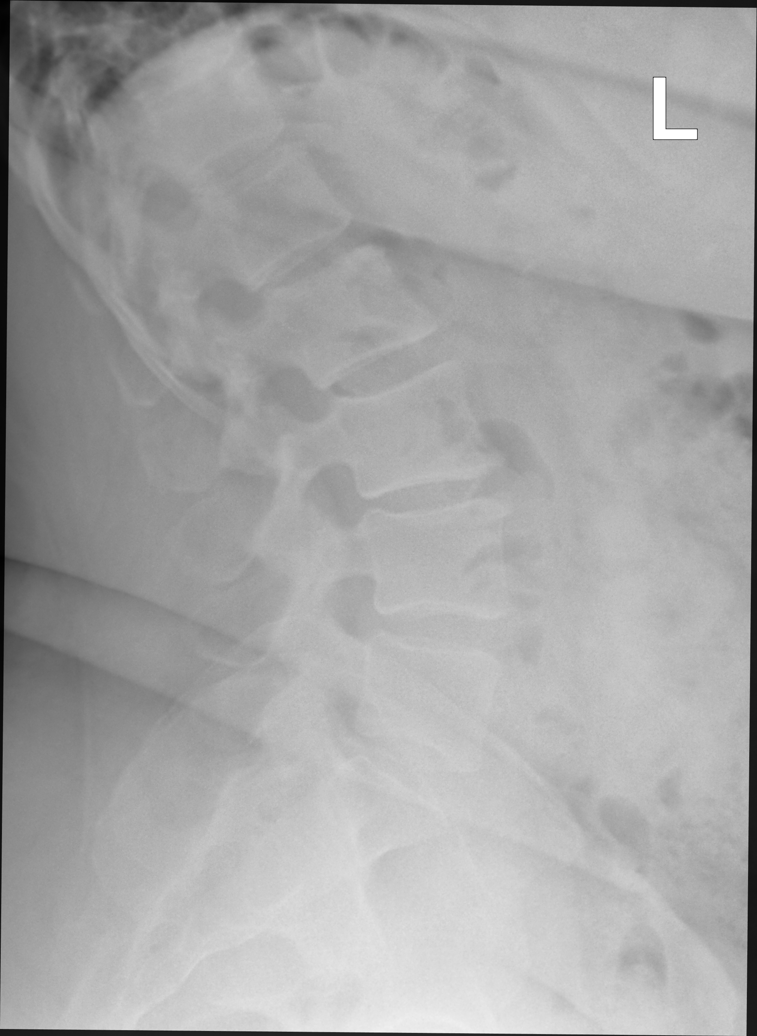

[lumbar spine lat (2 of 2)]
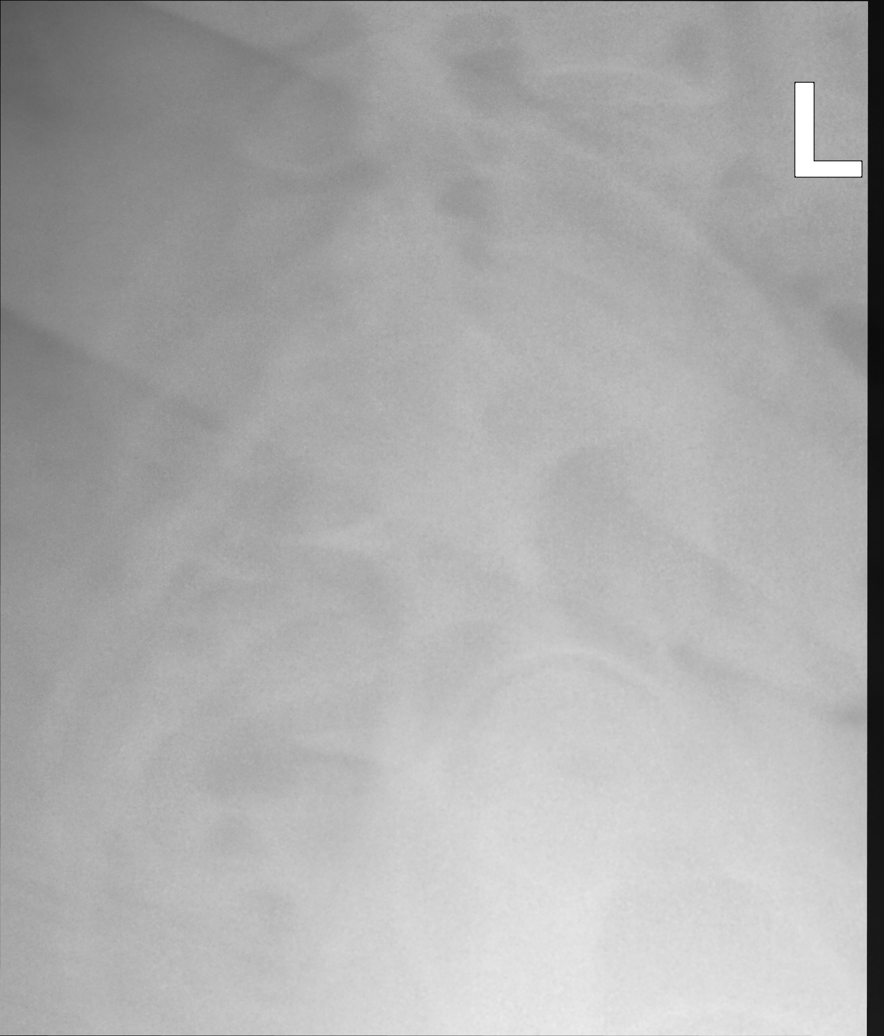

[lumbar spine oblique (1 of 2)]
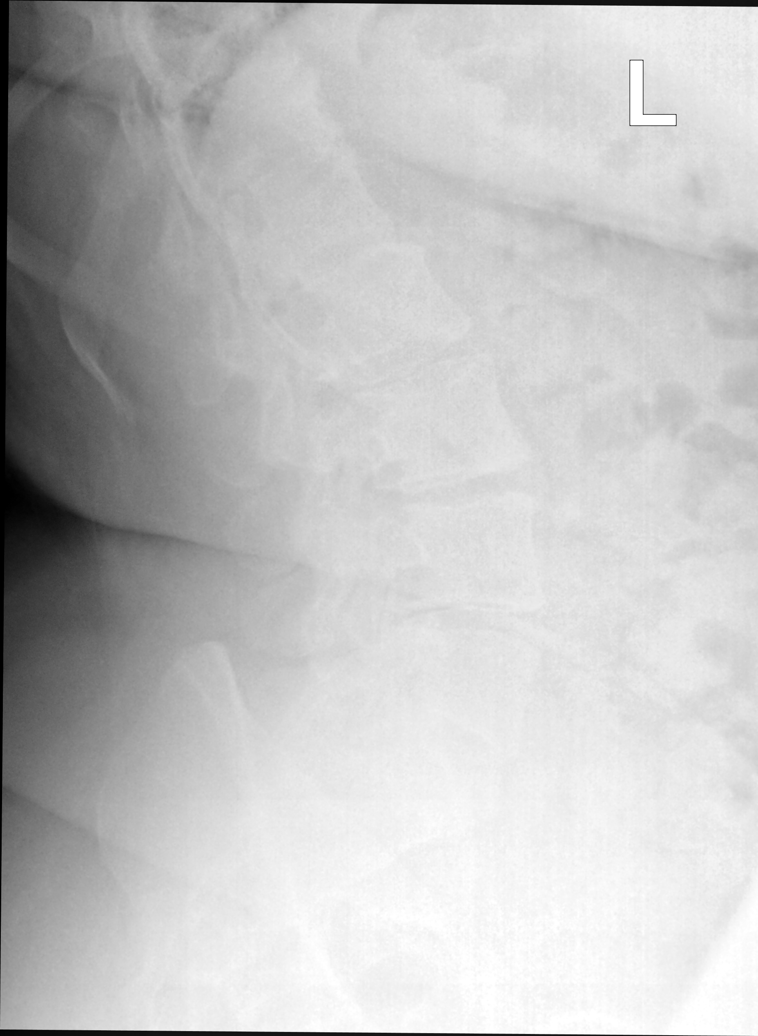

[lumbar spine oblique (2 of 2)]
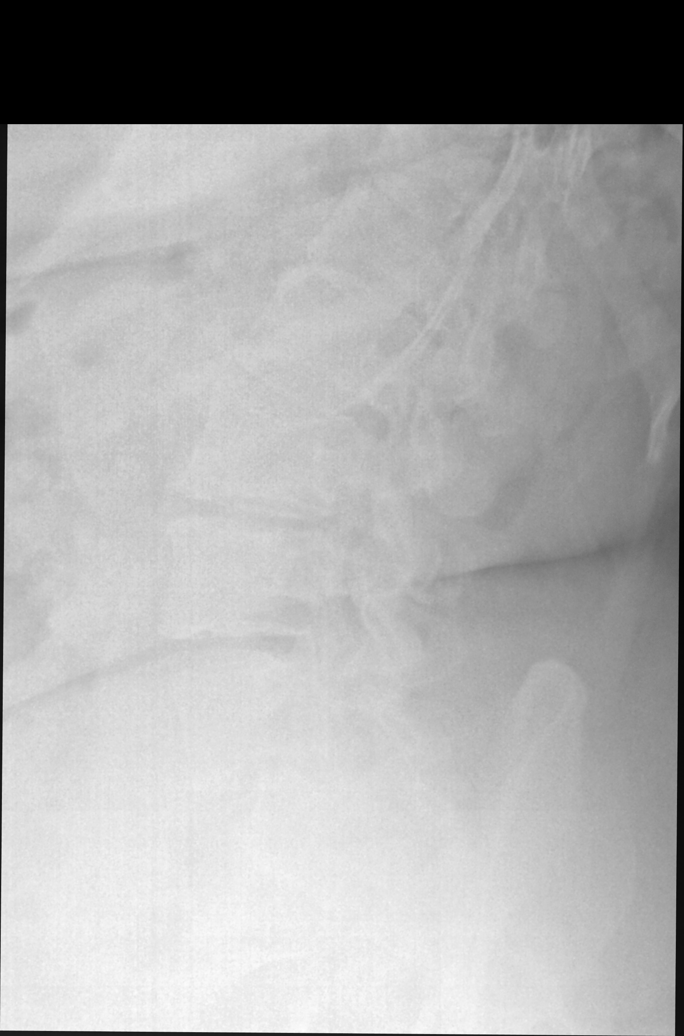

[6 of 6 positions shown; findings below may reference images not displayed]

FINDINGS: No fracture or spondylolisthesis is noted. Mild degenerative disc
disease is noted at L3-4 with anterior osteophyte formation.
Remaining disc spaces are unremarkable.
IMPRESSION: Mild degenerative disc disease is noted at L3-4. No acute
abnormality seen in the lumbar spine.

## 2020-07-26 ENCOUNTER — Other Ambulatory Visit: Payer: Self-pay | Admitting: Physician Assistant

## 2020-07-26 DIAGNOSIS — Z0000 Encounter for general adult medical examination without abnormal findings: Secondary | ICD-10-CM

## 2020-07-26 DIAGNOSIS — R7303 Prediabetes: Secondary | ICD-10-CM

## 2020-07-26 DIAGNOSIS — E559 Vitamin D deficiency, unspecified: Secondary | ICD-10-CM

## 2020-07-31 ENCOUNTER — Other Ambulatory Visit: Payer: BC Managed Care – PPO

## 2020-08-10 ENCOUNTER — Encounter: Payer: BC Managed Care – PPO | Admitting: Physician Assistant

## 2020-08-21 ENCOUNTER — Other Ambulatory Visit: Payer: Self-pay | Admitting: Physician Assistant

## 2020-08-21 DIAGNOSIS — E559 Vitamin D deficiency, unspecified: Secondary | ICD-10-CM

## 2020-08-21 DIAGNOSIS — R7303 Prediabetes: Secondary | ICD-10-CM

## 2020-08-21 DIAGNOSIS — Z0000 Encounter for general adult medical examination without abnormal findings: Secondary | ICD-10-CM

## 2020-08-27 ENCOUNTER — Encounter: Payer: Self-pay | Admitting: Physician Assistant

## 2020-08-27 ENCOUNTER — Other Ambulatory Visit: Payer: Self-pay

## 2020-09-03 ENCOUNTER — Encounter: Payer: Self-pay | Admitting: Physician Assistant

## 2020-09-28 ENCOUNTER — Other Ambulatory Visit: Payer: Self-pay | Admitting: Physician Assistant

## 2020-09-28 DIAGNOSIS — Z6837 Body mass index (BMI) 37.0-37.9, adult: Secondary | ICD-10-CM

## 2020-09-28 DIAGNOSIS — E6609 Other obesity due to excess calories: Secondary | ICD-10-CM

## 2020-12-24 ENCOUNTER — Other Ambulatory Visit: Payer: Self-pay | Admitting: Physician Assistant

## 2020-12-24 DIAGNOSIS — J3089 Other allergic rhinitis: Secondary | ICD-10-CM

## 2020-12-24 NOTE — Telephone Encounter (Signed)
Please reach out to patient to schedule a CPE with Maritza soonest available.

## 2021-02-11 ENCOUNTER — Other Ambulatory Visit: Payer: Self-pay | Admitting: Physician Assistant

## 2021-02-11 DIAGNOSIS — F5104 Psychophysiologic insomnia: Secondary | ICD-10-CM

## 2021-03-06 ENCOUNTER — Other Ambulatory Visit: Payer: Self-pay | Admitting: Physician Assistant

## 2021-03-06 DIAGNOSIS — J3089 Other allergic rhinitis: Secondary | ICD-10-CM

## 2021-03-06 DIAGNOSIS — F5104 Psychophysiologic insomnia: Secondary | ICD-10-CM

## 2021-03-06 DIAGNOSIS — F4323 Adjustment disorder with mixed anxiety and depressed mood: Secondary | ICD-10-CM

## 2021-04-30 ENCOUNTER — Other Ambulatory Visit: Payer: Self-pay

## 2021-04-30 DIAGNOSIS — F5104 Psychophysiologic insomnia: Secondary | ICD-10-CM

## 2021-04-30 DIAGNOSIS — J3089 Other allergic rhinitis: Secondary | ICD-10-CM

## 2021-04-30 DIAGNOSIS — F4323 Adjustment disorder with mixed anxiety and depressed mood: Secondary | ICD-10-CM

## 2021-04-30 MED ORDER — TRAZODONE HCL 100 MG PO TABS: 100.0000 mg | ORAL_TABLET | Freq: Every evening | ORAL | 0 refills | Status: DC | PRN

## 2021-04-30 MED ORDER — FLUOXETINE HCL 40 MG PO CAPS
40.0000 mg | ORAL_CAPSULE | Freq: Every day | ORAL | 0 refills | Status: DC
Start: 2021-04-30 — End: 2021-06-18

## 2021-04-30 MED ORDER — LEVOCETIRIZINE DIHYDROCHLORIDE 5 MG PO TABS: 5.0000 mg | ORAL_TABLET | Freq: Every evening | ORAL | 0 refills | Status: DC

## 2021-05-11 ENCOUNTER — Other Ambulatory Visit: Payer: Self-pay

## 2021-05-11 ENCOUNTER — Encounter: Payer: Self-pay | Admitting: Physician Assistant

## 2021-05-11 ENCOUNTER — Other Ambulatory Visit (HOSPITAL_COMMUNITY)
Admission: RE | Admit: 2021-05-11 | Discharge: 2021-05-11 | Disposition: A | Discharge status: Home / Self Care | Payer: BC Managed Care – PPO | Source: Ambulatory Visit | Attending: Physician Assistant | Admitting: Physician Assistant

## 2021-05-11 ENCOUNTER — Ambulatory Visit (INDEPENDENT_AMBULATORY_CARE_PROVIDER_SITE_OTHER): Payer: Self-pay | Admitting: Physician Assistant

## 2021-05-11 VITALS — BP 121/80 | HR 100 | Temp 98.0°F | Ht 67.0 in | Wt 263.0 lb

## 2021-05-11 DIAGNOSIS — Z1211 Encounter for screening for malignant neoplasm of colon: Secondary | ICD-10-CM

## 2021-05-11 DIAGNOSIS — Z13228 Encounter for screening for other metabolic disorders: Secondary | ICD-10-CM

## 2021-05-11 DIAGNOSIS — B379 Candidiasis, unspecified: Secondary | ICD-10-CM

## 2021-05-11 DIAGNOSIS — Z124 Encounter for screening for malignant neoplasm of cervix: Secondary | ICD-10-CM | POA: Diagnosis present

## 2021-05-11 DIAGNOSIS — Z1329 Encounter for screening for other suspected endocrine disorder: Secondary | ICD-10-CM

## 2021-05-11 DIAGNOSIS — Z130 Encounter for screening for diseases of the blood and blood-forming organs and certain disorders involving the immune mechanism: Secondary | ICD-10-CM

## 2021-05-11 DIAGNOSIS — Z23 Encounter for immunization: Secondary | ICD-10-CM

## 2021-05-11 DIAGNOSIS — Z1321 Encounter for screening for nutritional disorder: Secondary | ICD-10-CM

## 2021-05-11 DIAGNOSIS — E559 Vitamin D deficiency, unspecified: Secondary | ICD-10-CM

## 2021-05-11 DIAGNOSIS — R7303 Prediabetes: Secondary | ICD-10-CM

## 2021-05-11 DIAGNOSIS — Z0000 Encounter for general adult medical examination without abnormal findings: Secondary | ICD-10-CM

## 2021-05-11 DIAGNOSIS — Z1231 Encounter for screening mammogram for malignant neoplasm of breast: Secondary | ICD-10-CM

## 2021-05-11 MED ORDER — FLUCONAZOLE 100 MG PO TABS: 100.0000 mg | ORAL_TABLET | Freq: Once | ORAL | 0 refills | Status: AC

## 2021-05-11 NOTE — Patient Instructions (Signed)
Preventive Care 92-55 Years Old, Female Preventive care refers to lifestyle choices and visits with your health care provider that can promote health and wellness. Preventive care visits are also called wellness exams. What can I expect for my preventive care visit? Counseling Your health care provider may ask you questions about your: Medical history, including: Past medical problems. Family medical history. Pregnancy history. Current health, including: Menstrual cycle. Method of birth control. Emotional well-being. Home life and relationship well-being. Sexual activity and sexual health. Lifestyle, including: Alcohol, nicotine or tobacco, and drug use. Access to firearms. Diet, exercise, and sleep habits. Work and work Statistician. Sunscreen use. Safety issues such as seatbelt and bike helmet use. Physical exam Your health care provider will check your: Height and weight. These may be used to calculate your BMI (body mass index). BMI is a measurement that tells if you are at a healthy weight. Waist circumference. This measures the distance around your waistline. This measurement also tells if you are at a healthy weight and may help predict your risk of certain diseases, such as type 2 diabetes and high blood pressure. Heart rate and blood pressure. Body temperature. Skin for abnormal spots. What immunizations do I need? Vaccines are usually given at various ages, according to a schedule. Your health care provider will recommend vaccines for you based on your age, medical history, and lifestyle or other factors, such as travel or where you work. What tests do I need? Screening Your health care provider may recommend screening tests for certain conditions. This may include: Lipid and cholesterol levels. Diabetes screening. This is done by checking your blood sugar (glucose) after you have not eaten for a while (fasting). Pelvic exam and Pap test. Hepatitis B test. Hepatitis C  test. HIV (human immunodeficiency virus) test. STI (sexually transmitted infection) testing, if you are at risk. Lung cancer screening. Colorectal cancer screening. Mammogram. Talk with your health care provider about when you should start having regular mammograms. This may depend on whether you have a family history of breast cancer. BRCA-related cancer screening. This may be done if you have a family history of breast, ovarian, tubal, or peritoneal cancers. Bone density scan. This is done to screen for osteoporosis. Talk with your health care provider about your test results, treatment options, and if necessary, the need for more tests. Follow these instructions at home: Eating and drinking  Eat a diet that includes fresh fruits and vegetables, whole grains, lean protein, and low-fat dairy products. Take vitamin and mineral supplements as recommended by your health care provider. Do not drink alcohol if: Your health care provider tells you not to drink. You are pregnant, may be pregnant, or are planning to become pregnant. If you drink alcohol: Limit how much you have to 0-1 drink a day. Know how much alcohol is in your drink. In the [place] one drink equals one 12 oz bottle of beer (355 mL), one 5 oz glass of wine (148 mL), or one 1 oz glass of hard liquor (44 mL). Lifestyle Brush your teeth every morning and night with fluoride toothpaste. Floss one time each day. Exercise for at least 30 minutes 5 or more days each week. Do not use any products that contain nicotine or tobacco. These products include cigarettes, chewing tobacco, and vaping devices, such as e-cigarettes. If you need help quitting, ask your health care provider. Do not use drugs. If you are sexually active, practice safe sex. Use a condom or other form of protection to prevent  STIs. If you do not wish to become pregnant, use a form of birth control. If you plan to become pregnant, see your health care provider for a  prepregnancy visit. Take aspirin only as told by your health care provider. Make sure that you understand how much to take and what form to take. Work with your health care provider to find out whether it is safe and beneficial for you to take aspirin daily. Find healthy ways to manage stress, such as: Meditation, yoga, or listening to music. Journaling. Talking to a trusted person. Spending time with friends and family. Minimize exposure to UV radiation to reduce your risk of skin cancer. Safety Always wear your seat belt while driving or riding in a vehicle. Do not drive: If you have been drinking alcohol. Do not ride with someone who has been drinking. When you are tired or distracted. While texting. If you have been using any mind-altering substances or drugs. Wear a helmet and other protective equipment during sports activities. If you have firearms in your house, make sure you follow all gun safety procedures. Seek help if you have been physically or sexually abused. What's next? Visit your health care provider once a year for an annual wellness visit. Ask your health care provider how often you should have your eyes and teeth checked. Stay up to date on all vaccines. This information is not intended to replace advice given to you by your health care provider. Make sure you discuss any questions you have with your health care provider. Document Revised: 12/16/2020 Document Reviewed: 12/16/2020 Elsevier Patient Education  [place].

## 2021-05-11 NOTE — Progress Notes (Signed)
Subjective:     Makayla Matthews is a 55 y.o. female and is here for a comprehensive physical exam. The patient reports no problems.  Social History   Socioeconomic History   Marital status: Married    Spouse name: Gery Pray   Number of children: Not on file   Years of education: Not on file   Highest education level: Not on file  Occupational History   Occupation: Midwife  Tobacco Use   Smoking status: Never   Smokeless tobacco: Never  Vaping Use   Vaping Use: Never used  Substance and Sexual Activity   Alcohol use: Yes    Alcohol/week: 1.0 standard drink    Types: 1 Standard drinks or equivalent per week   Drug use: Never   Sexual activity: Yes    Birth control/protection: None  Other Topics Concern   Not on file  Social History Narrative   Not on file   Social Determinants of Health   Financial Resource Strain: Not on file  Food Insecurity: Not on file  Transportation Needs: Not on file  Physical Activity: Not on file  Stress: Not on file  Social Connections: Not on file  Intimate Partner Violence: Not on file   Health Maintenance  Topic Date Due   HIV Screening  Never done   Hepatitis C Screening  Never done   COLONOSCOPY (Pts 45-5yrs Insurance coverage will need to be confirmed)  Never done   Zoster Vaccines- Shingrix (1 of 2) Never done   MAMMOGRAM  08/31/2017   PAP SMEAR-Modifier  01/25/2018   COVID-19 Vaccine (3 - Booster for Moderna series) 12/03/2019   INFLUENZA VACCINE  02/01/2021   TETANUS/TDAP  05/12/2031   Pneumococcal Vaccine 24-12 Years old  Aged Out   HPV VACCINES  Aged Out    The following portions of the patient's history were reviewed and updated as appropriate: allergies, current medications, past family history, past medical history, past social history, past surgical history, and problem list.  Review of Systems Pertinent items noted in HPI and remainder of comprehensive ROS otherwise negative.   Objective:    BP 121/80    Pulse 100   Temp 98 F (36.7 C)   Ht 5\' 7"  (1.702 m)   Wt 263 lb (119.3 kg)   SpO2 97%   BMI 41.19 kg/m  General appearance: alert, cooperative, and no distress Head: Normocephalic, without obvious abnormality, atraumatic Eyes: conjunctivae/corneas clear. PERRL, EOM's intact. Fundi benign. Ears: normal TM's and external ear canals both ears Nose: Nares normal. Septum midline. Mucosa normal. No drainage or sinus tenderness. Throat: normal findings: lips normal without lesions and abnormal findings: thrush Neck: mild anterior cervical adenopathy, no JVD, supple, symmetrical, trachea midline, and thyroid: normal to inspection and palpation Back: symmetric, no curvature. ROM normal. No CVA tenderness. Lungs: clear to auscultation bilaterally Breasts: Inspection negative Heart: regular rate and rhythm and S1, S2 normal Abdomen: soft, non-tender; bowel sounds normal; no masses,  no organomegaly Pelvic: cervix normal in appearance, external genitalia normal, no adnexal masses or tenderness, no cervical motion tenderness, positive findings: vaginal discharge:  white and curd-like, and uterus normal size, shape, and consistency Extremities: extremities normal, atraumatic, no cyanosis or edema Pulses: 2+ and symmetric Skin: Skin color, texture, turgor normal. No rashes or lesions Lymph nodes: Cervical adenopathy: normal and Supraclavicular adenopathy: normal Neurologic: Grossly normal    Assessment:    Healthy female exam.     Plan:  -Pap collected. -Will obtain labs including Vitamin D.  -  Patient agreeable to Cologuard, mammogram and Tdap. -Discussed with patient has signs of candidal infection so will treat with 1x dose of fluconazole.  -Encourage to continue weight loss efforts with dietary and lifestyle changes. -Follow up in 4 months for mood, insomnia and wt  See After Visit Summary for Counseling Recommendations

## 2021-05-12 LAB — COMPREHENSIVE METABOLIC PANEL
ALT: 16 IU/L (ref 0–32)
AST: 17 IU/L (ref 0–40)
Albumin/Globulin Ratio: 1.5 (ref 1.2–2.2)
Albumin: 4.6 g/dL (ref 3.8–4.9)
Alkaline Phosphatase: 108 IU/L (ref 44–121)
BUN/Creatinine Ratio: 12 (ref 9–23)
BUN: 12 mg/dL (ref 6–24)
Bilirubin Total: 0.7 mg/dL (ref 0.0–1.2)
CO2: 24 mmol/L (ref 20–29)
Calcium: 9.9 mg/dL (ref 8.7–10.2)
Chloride: 101 mmol/L (ref 96–106)
Creatinine, Ser: 0.97 mg/dL (ref 0.57–1.00)
Globulin, Total: 3.1 g/dL (ref 1.5–4.5)
Glucose: 97 mg/dL (ref 70–99)
Potassium: 3.9 mmol/L (ref 3.5–5.2)
Sodium: 140 mmol/L (ref 134–144)
Total Protein: 7.7 g/dL (ref 6.0–8.5)
eGFR: 69 mL/min/{1.73_m2} (ref >59)

## 2021-05-12 LAB — CBC WITH DIFFERENTIAL/PLATELET
Basophils Absolute: 0.1 10*3/uL (ref 0.0–0.2)
Basos: 1 %
EOS (ABSOLUTE): 0.3 10*3/uL (ref 0.0–0.4)
Eos: 4 %
Hematocrit: 42.2 % (ref 34.0–46.6)
Hemoglobin: 13.8 g/dL (ref 11.1–15.9)
Immature Grans (Abs): 0 10*3/uL (ref 0.0–0.1)
Immature Granulocytes: 0 %
Lymphocytes Absolute: 3.1 10*3/uL (ref 0.7–3.1)
Lymphs: 41 %
MCH: 28.2 pg (ref 26.6–33.0)
MCHC: 32.7 g/dL (ref 31.5–35.7)
MCV: 86 fL (ref 79–97)
Monocytes Absolute: 0.6 10*3/uL (ref 0.1–0.9)
Monocytes: 8 %
Neutrophils Absolute: 3.6 10*3/uL (ref 1.4–7.0)
Neutrophils: 46 %
Platelets: 382 10*3/uL (ref 150–450)
RBC: 4.9 x10E6/uL (ref 3.77–5.28)
RDW: 13.6 % (ref 11.7–15.4)
WBC: 7.7 10*3/uL (ref 3.4–10.8)

## 2021-05-12 LAB — HEMOGLOBIN A1C
Est. average glucose Bld gHb Est-mCnc: 123 mg/dL
Hgb A1c MFr Bld: 5.9 % — ABNORMAL HIGH (ref 4.8–5.6)

## 2021-05-12 LAB — VITAMIN D 25 HYDROXY (VIT D DEFICIENCY, FRACTURES): Vit D, 25-Hydroxy: 41.3 ng/mL (ref 30.0–100.0)

## 2021-05-12 LAB — TSH: TSH: 2.02 u[IU]/mL (ref 0.450–4.500)

## 2021-05-12 LAB — LDL CHOLESTEROL, DIRECT: LDL Direct: 104 mg/dL — ABNORMAL HIGH (ref 0–99)

## 2021-05-13 LAB — CYTOLOGY - PAP
Adequacy: ABSENT
Comment: NEGATIVE
Diagnosis: NEGATIVE
High risk HPV: NEGATIVE

## 2021-06-03 ENCOUNTER — Other Ambulatory Visit: Payer: Self-pay | Admitting: Physician Assistant

## 2021-06-03 DIAGNOSIS — R921 Mammographic calcification found on diagnostic imaging of breast: Secondary | ICD-10-CM

## 2021-06-04 ENCOUNTER — Other Ambulatory Visit: Payer: Self-pay | Admitting: Physician Assistant

## 2021-06-04 DIAGNOSIS — J3089 Other allergic rhinitis: Secondary | ICD-10-CM

## 2021-06-18 ENCOUNTER — Other Ambulatory Visit: Payer: Self-pay | Admitting: Physician Assistant

## 2021-06-18 DIAGNOSIS — F5104 Psychophysiologic insomnia: Secondary | ICD-10-CM

## 2021-06-18 DIAGNOSIS — J3089 Other allergic rhinitis: Secondary | ICD-10-CM

## 2021-06-18 DIAGNOSIS — F4323 Adjustment disorder with mixed anxiety and depressed mood: Secondary | ICD-10-CM

## 2021-06-21 MED ORDER — TRAZODONE HCL 100 MG PO TABS: 100.0000 mg | ORAL_TABLET | Freq: Every evening | ORAL | 0 refills | Status: DC | PRN

## 2021-06-21 MED ORDER — FLUOXETINE HCL 40 MG PO CAPS: 40.0000 mg | ORAL_CAPSULE | Freq: Every day | ORAL | 0 refills | Status: DC

## 2021-06-21 MED ORDER — LEVOCETIRIZINE DIHYDROCHLORIDE 5 MG PO TABS: 5.0000 mg | ORAL_TABLET | Freq: Every evening | ORAL | 0 refills | Status: DC

## 2021-07-12 ENCOUNTER — Ambulatory Visit: Payer: BC Managed Care – PPO | Attending: Family

## 2021-07-12 DIAGNOSIS — Z23 Encounter for immunization: Secondary | ICD-10-CM

## 2021-07-12 NOTE — Progress Notes (Signed)
° °  Covid-19 Vaccination Clinic  Name:  YANELI KEITHLEY    MRN: [id number] DOB: June 17, 1966  07/12/2021  Ms. Rekowski was observed post Covid-19 immunization for 15 minutes without incident. She was provided with Vaccine Information Sheet and instruction to access the V-Safe system.   Ms. Jedlicka was instructed to call 911 with any severe reactions post vaccine: Difficulty breathing  Swelling of face and throat  A fast heartbeat  A bad rash all over body  Dizziness and weakness   Immunizations Administered     Name Date Dose VIS Date Route   Moderna Covid-19 vaccine Bivalent Booster 07/12/2021 11:00 AM 0.5 mL 02/13/2021 Intramuscular   Manufacturer: Moderna   Lot: [id number]   NDC: 29476-546-50

## 2021-09-02 ENCOUNTER — Other Ambulatory Visit: Payer: Self-pay | Admitting: Physician Assistant

## 2021-09-02 DIAGNOSIS — F4323 Adjustment disorder with mixed anxiety and depressed mood: Secondary | ICD-10-CM

## 2021-09-02 DIAGNOSIS — F5104 Psychophysiologic insomnia: Secondary | ICD-10-CM

## 2021-09-09 ENCOUNTER — Ambulatory Visit: Payer: Self-pay | Admitting: Physician Assistant

## 2021-09-09 ENCOUNTER — Ambulatory Visit: Payer: Self-pay | Admitting: Nurse Practitioner

## 2021-09-14 NOTE — Progress Notes (Signed)
? ?Established Patient Office Visit ? ?Subjective:  ?Patient ID: Makayla Matthews, female    DOB: Aug 13, 1965  Age: 56 y.o. MRN: [id number] ? ?CC:  ?Chief Complaint  ?Patient presents with  ? Follow-up  ? Weight Loss  ? ? ?HPI ?Makayla Matthews presents for follow up on mood, insomnia and prediabetes. ? ?Mood: Reports medication compliance. States mood has been overall stable. Denis labile mood or SI/HI. The last 2 weeks have been stressful with work but medication has helped. ? ?Insomnia: States taking Trazodone 100 mg every night which helps with sleep. Gets on average 6-7 hrs of sleep. Continues with same bedtime routine.  ? ?Prediabetes: No increased thirst or urination. Reports has resumed exercise routine (in the morning goes to the gym 45 minutes of cardio and also walks about 2 miles in the afternoons/evenings). Continues to struggle with eating especially carbohydrates. ? ?Allergies: Reports having runny eyes, sneezing, sore throat and postnasal drainage. In the morning will have green congestion and at night it will be  yellow. No fever or sinus pressure. Requesting refill of nasal spray and allergy medication.  ? ?Depression screen [facility] 2/9 09/15/2021 05/11/2021 06/08/2020 05/06/2020 04/08/2020  ?Decreased Interest 0 1 0 0 0  ?Down, Depressed, Hopeless 1 0 0 0 0  ?PHQ - 2 Score 1 1 0 0 0  ?Altered sleeping 0 0 0 0 0  ?Tired, decreased energy 1 1 0 0 1  ?Change in appetite 3 3 0 0 1  ?Feeling bad or failure about yourself  0 3 0 0 0  ?Trouble concentrating 0 0 0 0 0  ?Moving slowly or fidgety/restless 1 1 0 0 0  ?Suicidal thoughts 0 0 0 0 0  ?PHQ-9 Score 6 9 0 0 2  ?Difficult doing work/chores Not difficult at all Not difficult at all - - Not difficult at all  ?Some recent data might be hidden  ? ?GAD 7 : Generalized Anxiety Score 09/15/2021 05/11/2021 10/08/2019 01/15/2019  ?Nervous, Anxious, on Edge 1 3 0 2  ?Control/stop worrying _0 ?Worry too much - different things 1 1 0 3  ?Trouble relaxing 0 0 0 2  ?Restless  0 0 0 0  ?Easily annoyed or irritable 0 _1 ?Afraid - awful might happen 0 1 0 3  ?Total GAD 7 Score _2 ?Anxiety Difficulty Not difficult at all Not difficult at all Somewhat difficult -  ? ? ? ?Past Medical History:  ?Diagnosis Date  ? Anxiety   ? Back pain   ? Depression   ? Pre-diabetes   ? Vitamin D deficiency   ? ? ?Past Surgical History:  ?Procedure Laterality Date  ? CESAREAN SECTION  2004  ? ? ?Family History  ?Problem Relation Age of Onset  ? Diabetes Mother   ? Hypertension Mother   ? Thyroid disease Mother   ? Cancer Father   ? ? ?Social History  ? ?Socioeconomic History  ? Marital status: Married  ?  Spouse name: Alvester Chou  ? Number of children: Not on file  ? Years of education: Not on file  ? Highest education level: Not on file  ?Occupational History  ? Occupation: Oncologist  ?Tobacco Use  ? Smoking status: Never  ? Smokeless tobacco: Never  ?Vaping Use  ? Vaping Use: Never used  ?Substance and Sexual Activity  ? Alcohol use: Yes  ?  Alcohol/week: 1.0 standard drink  ?  Types: 1 Standard  drinks or equivalent per week  ? Drug use: Never  ? Sexual activity: Yes  ?  Birth control/protection: None  ?Other Topics Concern  ? Not on file  ?Social History Narrative  ? Not on file  ? ?Social Determinants of Health  ? ?Financial Resource Strain: Not on file  ?Food Insecurity: Not on file  ?Transportation Needs: Not on file  ?Physical Activity: Not on file  ?Stress: Not on file  ?Social Connections: Not on file  ?Intimate Partner Violence: Not on file  ? ? ?Outpatient Medications Prior to Visit  ?Medication Sig Dispense Refill  ? FLUoxetine (PROZAC) 40 MG capsule Take 1 capsule by mouth once daily 90 capsule 0  ? meloxicam (MOBIC) 15 MG tablet Take 1 tablet (15 mg total) by mouth daily. 30 tablet 0  ? Multiple Vitamin (MULTIVITAMIN WITH MINERALS) TABS tablet Take 1 tablet by mouth daily.    ? traZODone (DESYREL) 100 MG tablet TAKE 1 TABLET BY MOUTH AT BEDTIME AS NEEDED FOR SLEEP 90 tablet 0  ?  azelastine (ASTELIN) 0.1 % nasal spray 1 spray each nostril twice daily after sinus rinses 30 mL 1  ? levocetirizine (XYZAL) 5 MG tablet Take 1 tablet (5 mg total) by mouth every evening. 90 tablet 0  ? ?No facility-administered medications prior to visit.  ? ? ?Allergies  ?Allergen Reactions  ? Codeine Itching  ? ? ?ROS ?Review of Systems ?Review of Systems:  ?A fourteen system review of systems was performed and found to be positive as per HPI. ?  ?Objective:  ?  ?Physical Exam ?General:  Pleasant and cooperative, in no acute distress  ?Neuro:  Alert and oriented,  extra-ocular muscles intact  ?HEENT:  Normocephalic, atraumatic, neck supple  ?Skin:  no gross rash, warm, pink. ?Cardiac:  RRR, S1 S2 ?Respiratory: CTA B/L w/o wheezing, crackles or rales. ?Vascular:  Ext warm, no cyanosis apprec.; cap RF less 2 sec. ?Psych:  No HI/SI, judgement and insight good, Euthymic mood. Full Affect. ? ?BP 116/75   Pulse 92   Temp 98 ?F (36.7 ?C)   Ht 5' 6.5" (1.689 m)   Wt 280 lb (127 kg)   SpO2 98%   BMI 44.52 kg/m?  ?Wt Readings from Last 3 Encounters:  ?09/15/21 280 lb (127 kg)  ?05/11/21 263 lb (119.3 kg)  ?06/10/20 234 lb (106.1 kg)  ? ? ? ?Health Maintenance Due  ?Topic Date Due  ? HIV Screening  Never done  ? Hepatitis C Screening  Never done  ? COLONOSCOPY (Pts 45-89yr Insurance coverage will need to be confirmed)  Never done  ? Zoster Vaccines- Shingrix (1 of 2) Never done  ? MAMMOGRAM  08/31/2017  ? INFLUENZA VACCINE  02/01/2021  ? ? ?There are no preventive care reminders to display for this patient. ? ?Lab Results  ?Component Value Date  ? TSH 2.020 05/11/2021  ? ?Lab Results  ?Component Value Date  ? WBC 7.7 05/11/2021  ? HGB 13.8 05/11/2021  ? HCT 42.2 05/11/2021  ? MCV 86 05/11/2021  ? PLT 382 05/11/2021  ? ?Lab Results  ?Component Value Date  ? NA 140 05/11/2021  ? K 3.9 05/11/2021  ? CO2 24 05/11/2021  ? GLUCOSE 97 05/11/2021  ? BUN 12 05/11/2021  ? CREATININE 0.97 05/11/2021  ? BILITOT 0.7 05/11/2021  ?  ALKPHOS 108 05/11/2021  ? AST 17 05/11/2021  ? ALT 16 05/11/2021  ? PROT 7.7 05/11/2021  ? ALBUMIN 4.6 05/11/2021  ? CALCIUM 9.9 05/11/2021  ?  EGFR 69 05/11/2021  ? ?Lab Results  ?Component Value Date  ? CHOL 148 05/15/2019  ? ?Lab Results  ?Component Value Date  ? HDL 59 05/15/2019  ? ?Lab Results  ?Component Value Date  ? [place] 78 05/15/2019  ? ?Lab Results  ?Component Value Date  ? TRIG 54 05/15/2019  ? ?Lab Results  ?Component Value Date  ? CHOLHDL 2.6 08/07/2018  ? ?Lab Results  ?Component Value Date  ? HGBA1C 5.9 (H) 05/11/2021  ? ? ?  ?Assessment & Plan:  ? ?Problem List Items Addressed This Visit   ? ?  ? Other  ? Prediabetes  ?  -Last A1c stable at 5.9. Encourage to continue weight loss efforts with diet changes and exercise. Will continue to monitor. ?  ?  ? Environmental and seasonal allergies  ?  -Currently having exacerbation of symptoms. Will provide refill of levocetrizine 5 mg and Astelin nasal spray. Also recommend saline nasal rinses especially before using nasal spray. Will continue to monitor. ?  ?  ? Relevant Medications  ? azelastine (ASTELIN) 0.1 % nasal spray  ? levocetirizine (XYZAL) 5 MG tablet  ? Morbid obesity ([place])  ?  -Associated with hyperlipidemia and prediabetes. Discussed to continue exercise and recommend to reduce calories and diet changes. Patient wants to start medication therapy in conjunction with lifestyle changes, previously been on phentermine and tolerated without issues. Discussed medication is for 12 weeks. BP and pulse stable today. Pt verbalized understanding.  ?  ?  ? Relevant Medications  ? phentermine 37.5 MG capsule  ? Psychophysiological insomnia  ?  -Stable. ?-Continue current medication regimen. See med list. Will continue to monitor. ?  ?  ? Adjustment disorder with mixed anxiety and depressed mood - Primary  ?  -Stable. No SI/HI. ?-Continue current medication regimen. See med list. ?-Will continue to monitor. ?  ?  ? ?Other Visit Diagnoses   ? ? Colon cancer  screening      ? Relevant Orders  ? Ambulatory referral to Gastroenterology  ? ?  ? ? ?Meds ordered this encounter  ?Medications  ? azelastine (ASTELIN) 0.1 % nasal spray  ?  Sig: 1 spray each nostril twi

## 2021-09-15 ENCOUNTER — Other Ambulatory Visit: Payer: Self-pay

## 2021-09-15 ENCOUNTER — Encounter: Payer: Self-pay | Admitting: Physician Assistant

## 2021-09-15 ENCOUNTER — Ambulatory Visit (INDEPENDENT_AMBULATORY_CARE_PROVIDER_SITE_OTHER): Payer: BC Managed Care – PPO | Admitting: Physician Assistant

## 2021-09-15 VITALS — BP 116/75 | HR 92 | Temp 98.0°F | Ht 66.5 in | Wt 280.0 lb

## 2021-09-15 DIAGNOSIS — R7303 Prediabetes: Secondary | ICD-10-CM | POA: Diagnosis not present

## 2021-09-15 DIAGNOSIS — F5104 Psychophysiologic insomnia: Secondary | ICD-10-CM | POA: Diagnosis not present

## 2021-09-15 DIAGNOSIS — J3089 Other allergic rhinitis: Secondary | ICD-10-CM

## 2021-09-15 DIAGNOSIS — F4323 Adjustment disorder with mixed anxiety and depressed mood: Secondary | ICD-10-CM

## 2021-09-15 DIAGNOSIS — Z1211 Encounter for screening for malignant neoplasm of colon: Secondary | ICD-10-CM

## 2021-09-15 MED ORDER — PHENTERMINE HCL 37.5 MG PO CAPS: 37.5000 mg | ORAL_CAPSULE | ORAL | 0 refills | Status: DC

## 2021-09-15 MED ORDER — LEVOCETIRIZINE DIHYDROCHLORIDE 5 MG PO TABS: 5.0000 mg | ORAL_TABLET | Freq: Every evening | ORAL | 1 refills | Status: DC

## 2021-09-15 MED ORDER — AZELASTINE HCL 0.1 % NA SOLN: NASAL | 2 refills | Status: DC

## 2021-09-15 NOTE — Assessment & Plan Note (Signed)
>>  ASSESSMENT AND PLAN FOR MORBID OBESITY ([place]) WRITTEN ON 09/15/2021  3:01 PM BY ABONZA, MARITZA, PA-C  -Associated with hyperlipidemia and prediabetes. Discussed to continue exercise and recommend to reduce calories and diet changes. Patient wants to start medication therapy in conjunction with lifestyle changes, previously been on phentermine and tolerated without issues. Discussed medication is for 12 weeks. BP and pulse stable today. Pt verbalized understanding.

## 2021-09-15 NOTE — Assessment & Plan Note (Signed)
-  Last A1c stable at 5.9. Encourage to continue weight loss efforts with diet changes and exercise. Will continue to monitor. ?

## 2021-09-15 NOTE — Assessment & Plan Note (Signed)
-  Stable.  ?-Continue current medication regimen. See med list. ?-Will continue to monitor. ?

## 2021-09-15 NOTE — Assessment & Plan Note (Addendum)
-  Associated with hyperlipidemia and prediabetes. Discussed to continue exercise and recommend to reduce calories and diet changes. Patient wants to start medication therapy in conjunction with lifestyle changes, previously been on phentermine and tolerated without issues. Discussed medication is for 12 weeks. BP and pulse stable today. Pt verbalized understanding.  ?

## 2021-09-15 NOTE — Patient Instructions (Signed)
Calorie Counting for Weight Loss ?Calories are units of energy. Your body needs a certain number of calories from food to keep going throughout the day. When you eat or drink more calories than your body needs, your body stores the extra calories mostly as fat. When you eat or drink fewer calories than your body needs, your body burns fat to get the energy it needs. ?Calorie counting means keeping track of how many calories you eat and drink each day. Calorie counting can be helpful if you need to lose weight. If you eat fewer calories than your body needs, you should lose weight. Ask your health care provider what a healthy weight is for you. ?For calorie counting to work, you will need to eat the right number of calories each day to lose a healthy amount of weight per week. A dietitian can help you figure out how many calories you need in a day and will suggest ways to reach your calorie goal. ?A healthy amount of weight to lose each week is usually 1-2 lb (0.5-0.9 kg). This usually means that your daily calorie intake should be reduced by 500-750 calories. ?Eating 1,200-1,500 calories a day can help most women lose weight. ?Eating 1,500-1,800 calories a day can help most men lose weight. ?What do I need to know about calorie counting? ?Work with your health care provider or dietitian to determine how many calories you should get each day. To meet your daily calorie goal, you will need to: ?Find out how many calories are in each food that you would like to eat. Try to do this before you eat. ?Decide how much of the food you plan to eat. ?Keep a food log. Do this by writing down what you ate and how many calories it had. ?To successfully lose weight, it is important to balance calorie counting with a healthy lifestyle that includes regular activity. ?Where do I find calorie information? ?The number of calories in a food can be found on a Nutrition Facts label. If a food does not have a Nutrition Facts label, try to  look up the calories online or ask your dietitian for help. ?Remember that calories are listed per serving. If you choose to have more than one serving of a food, you will have to multiply the calories per serving by the number of servings you plan to eat. For example, the label on a package of bread might say that a serving size is 1 slice and that there are 90 calories in a serving. If you eat 1 slice, you will have eaten 90 calories. If you eat 2 slices, you will have eaten 180 calories. ?How do I keep a food log? ?After each time that you eat, record the following in your food log as soon as possible: ?What you ate. Be sure to include toppings, sauces, and other extras on the food. ?How much you ate. This can be measured in cups, ounces, or number of items. ?How many calories were in each food and drink. ?The total number of calories in the food you ate. ?Keep your food log near you, such as in a pocket-sized notebook or on an app or website on your mobile phone. Some programs will calculate calories for you and show you how many calories you have left to meet your daily goal. ?What are some portion-control tips? ?Know how many calories are in a serving. This will help you know how many servings you can have of a certain food. ?  Use a measuring cup to measure serving sizes. You could also try weighing out portions on a kitchen scale. With time, you will be able to estimate serving sizes for some foods. ?Take time to put servings of different foods on your favorite plates or in your favorite bowls and cups so you know what a serving looks like. ?Try not to eat straight from a food's packaging, such as from a bag or box. Eating straight from the package makes it hard to see how much you are eating and can lead to overeating. Put the amount you would like to eat in a cup or on a plate to make sure you are eating the right portion. ?Use smaller plates, glasses, and bowls for smaller portions and to prevent  overeating. ?Try not to multitask. For example, avoid watching TV or using your computer while eating. If it is time to eat, sit down at a table and enjoy your food. This will help you recognize when you are full. It will also help you be more mindful of what and how much you are eating. ?What are tips for following this plan? ?Reading food labels ?Check the calorie count compared with the serving size. The serving size may be smaller than what you are used to eating. ?Check the source of the calories. Try to choose foods that are high in protein, fiber, and vitamins, and low in saturated fat, trans fat, and sodium. ?Shopping ?Read nutrition labels while you shop. This will help you make healthy decisions about which foods to buy. ?Pay attention to nutrition labels for low-fat or fat-free foods. These foods sometimes have the same number of calories or more calories than the full-fat versions. They also often have added sugar, starch, or salt to make up for flavor that was removed with the fat. ?Make a grocery list of lower-calorie foods and stick to it. ?Cooking ?Try to cook your favorite foods in a healthier way. For example, try baking instead of frying. ?Use low-fat dairy products. ?Meal planning ?Use more fruits and vegetables. One-half of your plate should be fruits and vegetables. ?Include lean proteins, such as chicken, turkey, and fish. ?Lifestyle ?Each week, aim to do one of the following: ?150 minutes of moderate exercise, such as walking. ?75 minutes of vigorous exercise, such as running. ?General information ?Know how many calories are in the foods you eat most often. This will help you calculate calorie counts faster. ?Find a way of tracking calories that works for you. Get creative. Try different apps or programs if writing down calories does not work for you. ?What foods should I eat? ? ?Eat nutritious foods. It is better to have a nutritious, high-calorie food, such as an avocado, than a food with  few nutrients, such as a bag of potato chips. ?Use your calories on foods and drinks that will fill you up and will not leave you hungry soon after eating. ?Examples of foods that fill you up are nuts and nut butters, vegetables, lean proteins, and high-fiber foods such as whole grains. High-fiber foods are foods with more than 5 g of fiber per serving. ?Pay attention to calories in drinks. Low-calorie drinks include water and unsweetened drinks. ?The items listed above may not be a complete list of foods and beverages you can eat. Contact a dietitian for more information. ?What foods should I limit? ?Limit foods or drinks that are not good sources of vitamins, minerals, or protein or that are high in unhealthy fats. These include: ?  Candy. ?Other sweets. ?Sodas, specialty coffee drinks, alcohol, and juice. ?The items listed above may not be a complete list of foods and beverages you should avoid. Contact a dietitian for more information. ?How do I count calories when eating out? ?Pay attention to portions. Often, portions are much larger when eating out. Try these tips to keep portions smaller: ?Consider sharing a meal instead of getting your own. ?If you get your own meal, eat only half of it. Before you start eating, ask for a container and put half of your meal into it. ?When available, consider ordering smaller portions from the menu instead of full portions. ?Pay attention to your food and drink choices. Knowing the way food is cooked and what is included with the meal can help you eat fewer calories. ?If calories are listed on the menu, choose the lower-calorie options. ?Choose dishes that include vegetables, fruits, whole grains, low-fat dairy products, and lean proteins. ?Choose items that are boiled, broiled, grilled, or steamed. Avoid items that are buttered, battered, fried, or served with cream sauce. Items labeled as crispy are usually fried, unless stated otherwise. ?Choose water, low-fat milk,  unsweetened iced tea, or other drinks without added sugar. If you want an alcoholic beverage, choose a lower-calorie option, such as a glass of wine or light beer. ?Ask for dressings, sauces, and syrups on the side. T

## 2021-09-15 NOTE — Assessment & Plan Note (Signed)
-  Stable. No SI/HI. ?-Continue current medication regimen. See med list. ?-Will continue to monitor. ?

## 2021-09-15 NOTE — Assessment & Plan Note (Signed)
-  Currently having exacerbation of symptoms. Will provide refill of levocetrizine 5 mg and Astelin nasal spray. Also recommend saline nasal rinses especially before using nasal spray. Will continue to monitor. ?

## 2021-12-10 ENCOUNTER — Other Ambulatory Visit: Payer: Self-pay | Admitting: Physician Assistant

## 2021-12-10 DIAGNOSIS — F5104 Psychophysiologic insomnia: Secondary | ICD-10-CM

## 2021-12-10 DIAGNOSIS — F4323 Adjustment disorder with mixed anxiety and depressed mood: Secondary | ICD-10-CM

## 2022-01-18 ENCOUNTER — Encounter: Payer: Self-pay | Admitting: Physician Assistant

## 2022-01-18 ENCOUNTER — Ambulatory Visit (INDEPENDENT_AMBULATORY_CARE_PROVIDER_SITE_OTHER): Payer: BC Managed Care – PPO | Admitting: Physician Assistant

## 2022-01-18 VITALS — BP 114/77 | HR 93 | Ht 66.5 in | Wt 262.7 lb

## 2022-01-18 DIAGNOSIS — F4323 Adjustment disorder with mixed anxiety and depressed mood: Secondary | ICD-10-CM | POA: Diagnosis not present

## 2022-01-18 DIAGNOSIS — R7303 Prediabetes: Secondary | ICD-10-CM

## 2022-01-18 NOTE — Assessment & Plan Note (Signed)
>>  ASSESSMENT AND PLAN FOR MORBID OBESITY ([place]) WRITTEN ON 01/18/2022  4:21 PM BY ABONZA, MARITZA, PA-C  -Patient has lost 18 pounds since last visit. Recommend to continue weight loss efforts with diet and lifestyle changes. Discussed alternative anti-obesity medications including Wegovy which we can consider once medication is back on-stock. Will continue to monitor.

## 2022-01-18 NOTE — Patient Instructions (Signed)

## 2022-01-18 NOTE — Progress Notes (Signed)
Established patient visit   Patient: Makayla Matthews   DOB: [birth year]   56 y.o. Female  MRN: [id number] Visit Date: 01/18/2022  Chief Complaint  Patient presents with   Prediabetes   Weight Loss   mood   Subjective    HPI  Patient presents for chronic follow-up. Patient has no acute concerns.  Prediabetes: No denies increased urination or thirst. Has reduced carbohydrates and red meat.   Mood: Patient reports mood has been stable. Taking medication as directed without issues.   Weight: Patient reports completed Phentermine last month and tolerated without issues. Joined [employer] and goes in the mornings, 3 times per week. Tries to do cardio and full body workout. Eating organic vegetables and more fruit.     Medications: Outpatient Medications Prior to Visit  Medication Sig   azelastine (ASTELIN) 0.1 % nasal spray 1 spray each nostril twice daily after sinus rinses   FLUoxetine (PROZAC) 40 MG capsule Take 1 capsule by mouth once daily   levocetirizine (XYZAL) 5 MG tablet Take 1 tablet (5 mg total) by mouth every evening.   meloxicam (MOBIC) 15 MG tablet Take 1 tablet (15 mg total) by mouth daily.   Multiple Vitamin (MULTIVITAMIN WITH MINERALS) TABS tablet Take 1 tablet by mouth daily.   traZODone (DESYREL) 100 MG tablet TAKE 1 TABLET BY MOUTH AT BEDTIME AS NEEDED FOR SLEEP   [DISCONTINUED] phentermine 37.5 MG capsule Take 1 capsule (37.5 mg total) by mouth every morning.   No facility-administered medications prior to visit.    Review of Systems Review of Systems:  A fourteen system review of systems was performed and found to be positive as per HPI.   Last CBC Lab Results  Component Value Date   WBC 7.7 05/11/2021   HGB 13.8 05/11/2021   HCT 42.2 05/11/2021   MCV 86 05/11/2021   MCH 28.2 05/11/2021   RDW 13.6 05/11/2021   PLT 382 61/95/0932   Last metabolic panel Lab Results  Component Value Date   GLUCOSE 97 05/11/2021   NA 140 05/11/2021   K 3.9 05/11/2021    CL 101 05/11/2021   CO2 24 05/11/2021   BUN 12 05/11/2021   CREATININE 0.97 05/11/2021   EGFR 69 05/11/2021   CALCIUM 9.9 05/11/2021   PROT 7.7 05/11/2021   ALBUMIN 4.6 05/11/2021   LABGLOB 3.1 05/11/2021   AGRATIO 1.5 05/11/2021   BILITOT 0.7 05/11/2021   ALKPHOS 108 05/11/2021   AST 17 05/11/2021   ALT 16 05/11/2021   Last lipids Lab Results  Component Value Date   CHOL 148 05/15/2019   HDL 59 05/15/2019   LDLCALC 78 05/15/2019   LDLDIRECT 104 (H) 05/11/2021   TRIG 54 05/15/2019   CHOLHDL 2.6 08/07/2018   Last hemoglobin A1c Lab Results  Component Value Date   HGBA1C 5.9 (H) 05/11/2021   Last thyroid functions Lab Results  Component Value Date   TSH 2.020 05/11/2021      01/18/2022    2:55 PM 09/15/2021    2:51 PM 05/11/2021    2:23 PM 06/08/2020    9:19 AM 05/06/2020    3:11 PM  Depression screen PHQ 2/9  Decreased Interest 0 0 1 0 0  Down, Depressed, Hopeless 0 1 0 0 0  PHQ - 2 Score 0 1 1 0 0  Altered sleeping 1 0 0 0 0  Tired, decreased energy _0 0 0  Change in appetite 0 3 3 0 0  Feeling bad  or failure about yourself  0 0 3 0 0  Trouble concentrating 0 0 0 0 0  Moving slowly or fidgety/restless 0 1 1 0 0  Suicidal thoughts 0 0 0 0 0  PHQ-9 Score _0 0 0  Difficult doing work/chores Not difficult at all Not difficult at all Not difficult at all        01/18/2022    2:56 PM 09/15/2021    2:51 PM 05/11/2021    2:24 PM 10/08/2019    3:33 PM  GAD 7 : Generalized Anxiety Score  Nervous, Anxious, on Edge _1 0  Control/stop worrying 0 _2 Worry too much - different things 0 1 1 0  Trouble relaxing 0 0 0 0  Restless 0 0 0 0  Easily annoyed or irritable 0 0 1 2  Afraid - awful might happen 0 0 1 0  Total GAD 7 Score _3 Anxiety Difficulty Not difficult at all Not difficult at all Not difficult at all Somewhat difficult         Objective    BP 114/77   Pulse 93   Ht 5' 6.5" (1.689 m)   Wt 262 lb 11.2 oz (119.2 kg)   SpO2 98%  Comment: on RA  BMI 41.77 kg/m  BP Readings from Last 3 Encounters:  01/18/22 114/77  09/15/21 116/75  05/11/21 121/80   Wt Readings from Last 3 Encounters:  01/18/22 262 lb 11.2 oz (119.2 kg)  09/15/21 280 lb (127 kg)  05/11/21 263 lb (119.3 kg)    Physical Exam  General:  Pleasant and cooperative, in no acute distress, appropriate for stated age.  Neuro:  Alert and oriented,  extra-ocular muscles intact  HEENT:  Normocephalic, atraumatic, neck supple  Skin:  no gross rash, warm, pink. Cardiac:  RRR, S1 S2 Respiratory: CTA B/L  Vascular:  Ext warm, no cyanosis apprec.; cap RF less 2 sec. Psych:  No HI/SI, judgement and insight good, Euthymic mood. Full Affect.   No results found for any visits on 01/18/22.  Assessment & Plan      Problem List Items Addressed This Visit       Other   Prediabetes - Primary    -Last A1c 5.9, stable. Will repeat A1c with annual physical. Recommend to continue with weight loss efforts and low carbohydrate/sugar diet. Continue exercise regimen. Will continue to monitor.      Morbid obesity ([place])    -Patient has lost 18 pounds since last visit. Recommend to continue weight loss efforts with diet and lifestyle changes. Discussed alternative anti-obesity medications including Wegovy which we can consider once medication is back on-stock. Will continue to monitor.      Adjustment disorder with mixed anxiety and depressed mood    -Stable. -Continue current medication regimen. -Will continue to monitor.       Return in about 4 months (around 05/21/2022) for CPE and FBW.        Lorrene Reid, PA-C  [facility] at [facility] [phone] (phone) [phone] (fax)  [place]

## 2022-01-18 NOTE — Assessment & Plan Note (Signed)
-  Stable. -Continue current medication regimen.  -Will continue to monitor. 

## 2022-01-18 NOTE — Assessment & Plan Note (Signed)
-  Last A1c 5.9, stable. Will repeat A1c with annual physical. Recommend to continue with weight loss efforts and low carbohydrate/sugar diet. Continue exercise regimen. Will continue to monitor.

## 2022-01-18 NOTE — Assessment & Plan Note (Addendum)
-  Patient has lost 18 pounds since last visit. Recommend to continue weight loss efforts with diet and lifestyle changes. Discussed alternative anti-obesity medications including Wegovy which we can consider once medication is back on-stock. Will continue to monitor.

## 2022-02-09 ENCOUNTER — Encounter (INDEPENDENT_AMBULATORY_CARE_PROVIDER_SITE_OTHER): Payer: Self-pay

## 2022-04-12 ENCOUNTER — Other Ambulatory Visit: Payer: Self-pay | Admitting: Physician Assistant

## 2022-04-12 DIAGNOSIS — F4323 Adjustment disorder with mixed anxiety and depressed mood: Secondary | ICD-10-CM

## 2022-04-12 DIAGNOSIS — F5104 Psychophysiologic insomnia: Secondary | ICD-10-CM

## 2022-05-23 ENCOUNTER — Encounter: Payer: BC Managed Care – PPO | Admitting: Physician Assistant

## 2022-07-25 ENCOUNTER — Telehealth: Payer: Self-pay

## 2022-07-25 DIAGNOSIS — F4323 Adjustment disorder with mixed anxiety and depressed mood: Secondary | ICD-10-CM

## 2022-07-25 DIAGNOSIS — F5104 Psychophysiologic insomnia: Secondary | ICD-10-CM

## 2022-07-25 MED ORDER — TRAZODONE HCL 100 MG PO TABS: 100.0000 mg | ORAL_TABLET | Freq: Every evening | ORAL | 0 refills | Status: DC | PRN

## 2022-07-25 MED ORDER — FLUOXETINE HCL 40 MG PO CAPS: 40.0000 mg | ORAL_CAPSULE | Freq: Every day | ORAL | 0 refills | Status: DC

## 2022-07-25 NOTE — Addendum Note (Signed)
Addended by: Gemma Payor on: 07/25/2022 03:56 PM   Modules accepted: Orders

## 2022-07-25 NOTE — Telephone Encounter (Signed)
L.O.V: 01/18/22   N.O.V: Not scheduled   Patient needs appointment for refills

## 2022-07-25 NOTE — Telephone Encounter (Signed)
30 day sent 

## 2022-07-25 NOTE — Telephone Encounter (Signed)
Pt has been scheduled on 08/31/22 at 10:50.

## 2022-08-30 NOTE — Progress Notes (Unsigned)
Complete physical exam  Patient: Makayla Matthews   DOB: [birth year]   57 y.o. Female  MRN: [id number]  Subjective:    No chief complaint on file.   Makayla Matthews is a 57 y.o. female who presents today for a complete physical exam. She reports consuming a {diet types:17450} diet. {types:19826} She generally feels {DESC; WELL/FAIRLY WELL/POORLY:18703}. She reports sleeping {DESC; WELL/FAIRLY WELL/POORLY:18703}. She {does/does not:200015} have additional problems to discuss today.    Most recent fall risk assessment:    01/18/2022    2:55 PM  [place] in the past year? 0  Risk for fall due to : No Fall Risks  Follow up Falls evaluation completed     Most recent depression screenings:    01/18/2022    2:55 PM 09/15/2021    2:51 PM  PHQ 2/9 Scores  PHQ - 2 Score 0 1  PHQ- 9 Score 2 6    Patient Active Problem List   Diagnosis Date Noted   Psychophysiological insomnia 01/15/2019   Adjustment disorder with mixed anxiety and depressed mood 01/15/2019   Noncompliance with diet and medication regimen 08/07/2018   Class 2 obesity due to excess calories with body mass index (BMI) of 38.0 to 38.9 in adult 08/07/2018   Elevated blood pressure reading 02/05/2018   Prediabetes 02/05/2018   Environmental and seasonal allergies 02/05/2018   Venous insufficiency (chronic) (peripheral)- R >etr L LExt 02/05/2018   Vitamin D insufficiency 02/05/2018    Past Surgical History:  Procedure Laterality Date   CESAREAN SECTION  2004   Social History   Tobacco Use   Smoking status: Never   Smokeless tobacco: Never  Vaping Use   Vaping Use: Never used  Substance Use Topics   Alcohol use: Yes    Alcohol/week: 1.0 standard drink of alcohol    Types: 1 Standard drinks or equivalent per week   Drug use: Never   Family History  Problem Relation Age of Onset   Diabetes Mother    Hypertension Mother    Thyroid disease Mother    Cancer Father    Allergies  Allergen Reactions    Codeine Itching     Patient Care Team: Lorrene Reid, PA-C (Inactive) as PCP - Loleta Chance, FNP (General Practice)   Outpatient Medications Prior to Visit  Medication Sig   azelastine (ASTELIN) 0.1 % nasal spray 1 spray each nostril twice daily after sinus rinses   FLUoxetine (PROZAC) 40 MG capsule Take 1 capsule (40 mg total) by mouth daily.   levocetirizine (XYZAL) 5 MG tablet Take 1 tablet (5 mg total) by mouth every evening.   meloxicam (MOBIC) 15 MG tablet Take 1 tablet (15 mg total) by mouth daily.   Multiple Vitamin (MULTIVITAMIN WITH MINERALS) TABS tablet Take 1 tablet by mouth daily.   traZODone (DESYREL) 100 MG tablet Take 1 tablet (100 mg total) by mouth at bedtime as needed. for sleep   No facility-administered medications prior to visit.    ROS     Objective:    There were no vitals taken for this visit.   Physical Exam   No results found for any visits on 08/31/22.     Assessment & Plan:    Routine Health Maintenance and Physical Exam  Immunization History  Administered Date(s) Administered   Influenza,inj,Quad PF,6+ Mos 07/09/2017, 04/28/2018   Moderna Covid-19 Vaccine Bivalent Booster 3yr & up 07/12/2021   Moderna Sars-Covid-2 Vaccination 08/26/2019, 10/08/2019   PPD Test  02/05/2018   Tdap 05/11/2021    Health Maintenance  Topic Date Due   HIV Screening  Never done   Hepatitis C Screening  Never done   COLONOSCOPY (Pts 45-30yr Insurance coverage will need to be confirmed)  Never done   Zoster Vaccines- Shingrix (1 of 2) Never done   MAMMOGRAM  08/31/2017   INFLUENZA VACCINE  02/01/2022   COVID-19 Vaccine (4 - 2023-24 season) 03/04/2022   PAP SMEAR-Modifier  05/11/2024   DTaP/Tdap/Td (2 - Td or Tdap) 05/12/2031   HPV VACCINES  Aged Out    Discussed health benefits of physical activity, and encouraged her to engage in regular exercise appropriate for her age and condition.  There are no diagnoses linked to this  encounter.  No follow-ups on file.     MVelva Harman PA

## 2022-08-31 ENCOUNTER — Ambulatory Visit (INDEPENDENT_AMBULATORY_CARE_PROVIDER_SITE_OTHER): Payer: BC Managed Care – PPO | Admitting: Family Medicine

## 2022-08-31 ENCOUNTER — Encounter: Payer: Self-pay | Admitting: Family Medicine

## 2022-08-31 VITALS — BP 122/81 | HR 73 | Resp 18 | Ht 66.5 in | Wt 278.0 lb

## 2022-08-31 DIAGNOSIS — F5104 Psychophysiologic insomnia: Secondary | ICD-10-CM

## 2022-08-31 DIAGNOSIS — J3089 Other allergic rhinitis: Secondary | ICD-10-CM

## 2022-08-31 DIAGNOSIS — E559 Vitamin D deficiency, unspecified: Secondary | ICD-10-CM | POA: Diagnosis not present

## 2022-08-31 DIAGNOSIS — Z0000 Encounter for general adult medical examination without abnormal findings: Secondary | ICD-10-CM | POA: Diagnosis not present

## 2022-08-31 DIAGNOSIS — F4323 Adjustment disorder with mixed anxiety and depressed mood: Secondary | ICD-10-CM

## 2022-08-31 DIAGNOSIS — Z6838 Body mass index (BMI) 38.0-38.9, adult: Secondary | ICD-10-CM

## 2022-08-31 DIAGNOSIS — R7303 Prediabetes: Secondary | ICD-10-CM

## 2022-08-31 DIAGNOSIS — Z1231 Encounter for screening mammogram for malignant neoplasm of breast: Secondary | ICD-10-CM

## 2022-08-31 DIAGNOSIS — E6609 Other obesity due to excess calories: Secondary | ICD-10-CM | POA: Diagnosis not present

## 2022-08-31 DIAGNOSIS — Z23 Encounter for immunization: Secondary | ICD-10-CM

## 2022-08-31 DIAGNOSIS — Z1211 Encounter for screening for malignant neoplasm of colon: Secondary | ICD-10-CM

## 2022-08-31 MED ORDER — AZELASTINE HCL 0.1 % NA SOLN: NASAL | 3 refills | Status: AC

## 2022-08-31 MED ORDER — TRAZODONE HCL 100 MG PO TABS: 100.0000 mg | ORAL_TABLET | Freq: Every evening | ORAL | 1 refills | Status: DC | PRN

## 2022-08-31 MED ORDER — LEVOCETIRIZINE DIHYDROCHLORIDE 5 MG PO TABS: 5.0000 mg | ORAL_TABLET | Freq: Every evening | ORAL | 3 refills | Status: DC

## 2022-08-31 MED ORDER — PHENTERMINE HCL 15 MG PO CAPS: 15.0000 mg | ORAL_CAPSULE | ORAL | 1 refills | Status: DC

## 2022-08-31 MED ORDER — MELOXICAM 15 MG PO TABS: 15.0000 mg | ORAL_TABLET | Freq: Every day | ORAL | 0 refills | Status: AC

## 2022-08-31 MED ORDER — FLUOXETINE HCL 40 MG PO CAPS: 40.0000 mg | ORAL_CAPSULE | Freq: Every day | ORAL | 3 refills | Status: DC

## 2022-08-31 NOTE — Assessment & Plan Note (Signed)
Last A1c 5.9, ordered A1c again today.  Encouraged to continue weight loss efforts and to limit sweets and simple carbohydrates.  Encouraged to continue regular exercise.  She is also going to be meeting with [facility] for weight management which should also impact her blood sugars.

## 2022-08-31 NOTE — Assessment & Plan Note (Signed)
Encouraged her to continue the changes that she has already made to her diet and exercise routines.  We discussed options including phentermine, GLP-1 medications, referral to a nutritionist, and the [facility] clinic run by [facility].  She would like to start phentermine as she has used in the past and tolerated it well, and she would also like to see the [facility] clinic as she would benefit from more frequent follow-up.  I will see her back in 1 month to monitor healthy phentermine is affecting her.  We went over mechanism of action, side effects, and how to take it.  Patient expressed understanding and is agreeable with this plan.

## 2022-08-31 NOTE — Assessment & Plan Note (Signed)
Testing vitamin D levels today.  May recommend restarting supplement if it is low again.

## 2022-08-31 NOTE — Assessment & Plan Note (Signed)
Stable, PHQ-9 and GAD-7 scores both 0 today.  Continue Prozac 40 mg daily and trazodone 100 mg as needed at bedtime.  Will continue to monitor.

## 2022-08-31 NOTE — Patient Instructions (Signed)
Podcast: Anti Aging Unraveled with Jasmine December DO

## 2022-09-01 LAB — COMPREHENSIVE METABOLIC PANEL
ALT: 25 IU/L (ref 0–32)
AST: 27 IU/L (ref 0–40)
Albumin/Globulin Ratio: 1.6 (ref 1.2–2.2)
Albumin: 4.6 g/dL (ref 3.8–4.9)
Alkaline Phosphatase: 109 IU/L (ref 44–121)
BUN/Creatinine Ratio: 11 (ref 9–23)
BUN: 8 mg/dL (ref 6–24)
Bilirubin Total: 0.5 mg/dL (ref 0.0–1.2)
CO2: 23 mmol/L (ref 20–29)
Calcium: 9.8 mg/dL (ref 8.7–10.2)
Chloride: 102 mmol/L (ref 96–106)
Creatinine, Ser: 0.71 mg/dL (ref 0.57–1.00)
Globulin, Total: 2.9 g/dL (ref 1.5–4.5)
Glucose: 97 mg/dL (ref 70–99)
Potassium: 4.3 mmol/L (ref 3.5–5.2)
Sodium: 140 mmol/L (ref 134–144)
Total Protein: 7.5 g/dL (ref 6.0–8.5)
eGFR: 99 mL/min/{1.73_m2} (ref >59)

## 2022-09-01 LAB — CBC WITH DIFFERENTIAL/PLATELET
Basophils Absolute: 0.1 10*3/uL (ref 0.0–0.2)
Basos: 1 %
EOS (ABSOLUTE): 0.4 10*3/uL (ref 0.0–0.4)
Eos: 5 %
Hematocrit: 39.5 % (ref 34.0–46.6)
Hemoglobin: 12.9 g/dL (ref 11.1–15.9)
Immature Grans (Abs): 0 10*3/uL (ref 0.0–0.1)
Immature Granulocytes: 0 %
Lymphocytes Absolute: 2.9 10*3/uL (ref 0.7–3.1)
Lymphs: 38 %
MCH: 29.3 pg (ref 26.6–33.0)
MCHC: 32.7 g/dL (ref 31.5–35.7)
MCV: 90 fL (ref 79–97)
Monocytes Absolute: 0.6 10*3/uL (ref 0.1–0.9)
Monocytes: 9 %
Neutrophils Absolute: 3.6 10*3/uL (ref 1.4–7.0)
Neutrophils: 47 %
Platelets: 377 10*3/uL (ref 150–450)
RBC: 4.41 x10E6/uL (ref 3.77–5.28)
RDW: 14 % (ref 11.7–15.4)
WBC: 7.6 10*3/uL (ref 3.4–10.8)

## 2022-09-01 LAB — HEMOGLOBIN A1C
Est. average glucose Bld gHb Est-mCnc: 137 mg/dL
Hgb A1c MFr Bld: 6.4 % — ABNORMAL HIGH (ref 4.8–5.6)

## 2022-09-01 LAB — TSH: TSH: 1.58 u[IU]/mL (ref 0.450–4.500)

## 2022-09-01 LAB — LIPID PANEL
Chol/HDL Ratio: 3 ratio (ref 0.0–4.4)
Cholesterol, Total: 190 mg/dL (ref 100–199)
HDL: 63 mg/dL (ref >39)
LDL Chol Calc (NIH): 109 mg/dL — ABNORMAL HIGH (ref 0–99)
Triglycerides: 98 mg/dL (ref 0–149)
VLDL Cholesterol Cal: 18 mg/dL (ref 5–40)

## 2022-09-01 LAB — VITAMIN D 25 HYDROXY (VIT D DEFICIENCY, FRACTURES): Vit D, 25-Hydroxy: 49.5 ng/mL (ref 30.0–100.0)

## 2022-09-07 ENCOUNTER — Other Ambulatory Visit: Payer: Self-pay | Admitting: Family Medicine

## 2022-09-07 DIAGNOSIS — R921 Mammographic calcification found on diagnostic imaging of breast: Secondary | ICD-10-CM

## 2022-09-28 ENCOUNTER — Ambulatory Visit: Payer: BC Managed Care – PPO | Admitting: Family Medicine

## 2022-09-28 ENCOUNTER — Encounter: Payer: Self-pay | Admitting: Family Medicine

## 2022-09-28 VITALS — BP 119/76 | HR 81 | Resp 18 | Ht 66.5 in | Wt 275.0 lb

## 2022-09-28 DIAGNOSIS — Z6838 Body mass index (BMI) 38.0-38.9, adult: Secondary | ICD-10-CM | POA: Diagnosis not present

## 2022-09-28 DIAGNOSIS — E6609 Other obesity due to excess calories: Secondary | ICD-10-CM

## 2022-09-28 MED ORDER — PHENTERMINE HCL 15 MG PO CAPS: 30.0000 mg | ORAL_CAPSULE | ORAL | 1 refills | Status: DC

## 2022-09-28 NOTE — Assessment & Plan Note (Addendum)
Blood pressure stable today.  Encouraged to continue with dietary changes and exercise under guidance of personal trainer.  Tolerating phentermine well, discussed trial of increasing to 30 mg daily with option to go back down if she does not tolerate it well.  Patient verbalized understanding and is agreeable to this plan.  Visit #2: Starting Weight: 278 lbs  Current weight: 275 lbs Previous weight: 278 lbs  Change in weight: -3 lbs Goal weight: 225 lbs Dietary goals: limit bread and pasta; eat lots of vegetables and protein Exercise goals: 3 days a week with personal trainer, 2 days a week walking on her own Medication: phentermine 30 mg daily Follow-up and referrals: 6 wk

## 2022-09-28 NOTE — Patient Instructions (Signed)
Keep up the good work, you are doing amazing!!!  You can take 2 tablets of phentermine every morning if you tolerate it well.  If not, you can go back to taking just 1 every morning.

## 2022-09-28 NOTE — Progress Notes (Signed)
   Established Patient Office Visit  Subjective   Patient ID: Makayla Matthews, female    DOB: 10/08/1965  Age: 57 y.o. MRN: [id number]  Chief Complaint  Patient presents with   Weight Management Screening    HPI Makayla Matthews is a 57 y.o. female presenting today for follow up of weight management. Weight management: Weight has decreased 3 lbs since last visit. They have been following a low-carb, high-protein diet prioritizing vegetables and lean meats, avoiding bread and pasta. Exercise routine includes going to the gym with a personal trainer 3 times a week and spending the other 2 days a week walking on her own.  she is currently taking phentermine. Goal weight is 225 lbs.  Tolerating phentermine well, denies palpitations, headache, insomnia.  ROS Negative unless otherwise noted in HPI   Objective:     BP 119/76 (BP Location: Left Arm, Patient Position: Sitting, Cuff Size: Large)   Pulse 81   Resp 18   Ht 5' 6.5" (1.689 m)   Wt 275 lb (124.7 kg)   SpO2 95%   BMI 43.72 kg/m   Physical Exam Constitutional:      General: She is not in acute distress.    Appearance: Normal appearance.  HENT:     Head: Normocephalic and atraumatic.  Cardiovascular:     Rate and Rhythm: Normal rate and regular rhythm.     Pulses: Normal pulses.     Heart sounds: Normal heart sounds. No murmur heard.    No friction rub. No gallop.  Pulmonary:     Effort: Pulmonary effort is normal. No respiratory distress.     Breath sounds: No wheezing, rhonchi or rales.  Skin:    General: Skin is warm and dry.  Neurological:     Mental Status: She is alert and oriented to person, place, and time.     Assessment & Plan:  Class 2 obesity due to excess calories without serious comorbidity with body mass index (BMI) of 38.0 to 38.9 in adult Assessment & Plan: Blood pressure stable today.  Encouraged to continue with dietary changes and exercise under guidance of personal trainer.  Tolerating phentermine  well, discussed trial of increasing to 30 mg daily with option to go back down if she does not tolerate it well.  Patient verbalized understanding and is agreeable to this plan.  Visit #2: Starting Weight: 278 lbs  Current weight: 275 lbs Previous weight: 278 lbs  Change in weight: -3 lbs Goal weight: 225 lbs Dietary goals: limit bread and pasta; eat lots of vegetables and protein Exercise goals: 3 days a week with personal trainer, 2 days a week walking on her own Medication: phentermine 30 mg daily Follow-up and referrals: 6 wk   Orders: -     Phentermine HCl; Take 2 capsules (30 mg total) by mouth every morning.  Dispense: 60 capsule; Refill: 1    Return in about 6 weeks (around 11/09/2022) for follow-up for weight management and phentermine increase.    Velva Harman, PA

## 2022-11-14 ENCOUNTER — Ambulatory Visit: Payer: BC Managed Care – PPO | Admitting: Family Medicine

## 2022-11-14 NOTE — Assessment & Plan Note (Deleted)
Blood pressure stable today.  Encouraged to continue with dietary changes and exercise under guidance of personal trainer.  Tolerating phentermine well, discussed trial of increasing to 30 mg daily with option to go back down if she does not tolerate it well.  Patient verbalized understanding and is agreeable to this plan.  Visit #3: Starting Weight: 278 lbs  Current weight:  lbs Previous weight: 275 lbs  Change in weight:  lbs Goal weight: 225 lbs Dietary goals: limit bread and pasta; eat lots of vegetables and protein Exercise goals: 3 days a week with personal trainer, 2 days a week walking on her own Medication: phentermine 30 mg daily Follow-up and referrals:

## 2022-11-14 NOTE — Progress Notes (Deleted)
   Established Patient Office Visit  Subjective   Patient ID: Makayla Matthews, female    DOB: [birth year]  Age: 57 y.o. MRN: [id number]  No chief complaint on file.   HPI Makayla Matthews is a 57 y.o. female presenting today for follow up of weight management. Weight management: Weight has {increased/decreased/remained stable:29164} *** lbs since last visit. They have been following a *** diet prioritizing ***. Exercise routine includes ***.  She is currently taking phentermine 30 mg, increased from 15 to 30 mg at last appointment. Goal weight is 225 lbs.   ROS Negative unless otherwise noted in HPI   Objective:     There were no vitals taken for this visit.  Physical Exam   Assessment & Plan:  There are no diagnoses linked to this encounter.  No follow-ups on file.    Melida Quitter, PA

## 2022-11-23 ENCOUNTER — Other Ambulatory Visit: Payer: Self-pay | Admitting: Family Medicine

## 2023-03-10 ENCOUNTER — Other Ambulatory Visit: Payer: Self-pay | Admitting: Family Medicine

## 2023-03-10 DIAGNOSIS — E6609 Other obesity due to excess calories: Secondary | ICD-10-CM

## 2023-03-10 NOTE — Telephone Encounter (Signed)
Sending to fill enough until appt on 03/31/2023, will need appt for any refills after that if she misses

## 2023-03-18 ENCOUNTER — Other Ambulatory Visit: Payer: Self-pay | Admitting: Family Medicine

## 2023-03-18 DIAGNOSIS — E6609 Other obesity due to excess calories: Secondary | ICD-10-CM

## 2023-03-20 NOTE — Telephone Encounter (Signed)
Already sent prescription on 03/10/2023, prescription started on 03/18/2023.

## 2023-03-28 ENCOUNTER — Other Ambulatory Visit: Payer: Self-pay | Admitting: Nurse Practitioner

## 2023-03-28 DIAGNOSIS — F5104 Psychophysiologic insomnia: Secondary | ICD-10-CM

## 2023-03-31 ENCOUNTER — Telehealth (INDEPENDENT_AMBULATORY_CARE_PROVIDER_SITE_OTHER): Payer: BC Managed Care – PPO | Admitting: Family Medicine

## 2023-03-31 ENCOUNTER — Encounter: Payer: Self-pay | Admitting: Family Medicine

## 2023-03-31 VITALS — BP 124/76 | Ht 66.5 in | Wt 248.0 lb

## 2023-03-31 DIAGNOSIS — Z6838 Body mass index (BMI) 38.0-38.9, adult: Secondary | ICD-10-CM | POA: Diagnosis not present

## 2023-03-31 DIAGNOSIS — E6609 Other obesity due to excess calories: Secondary | ICD-10-CM | POA: Diagnosis not present

## 2023-03-31 DIAGNOSIS — Z713 Dietary counseling and surveillance: Secondary | ICD-10-CM | POA: Diagnosis not present

## 2023-03-31 DIAGNOSIS — Z1159 Encounter for screening for other viral diseases: Secondary | ICD-10-CM

## 2023-03-31 MED ORDER — PHENTERMINE HCL 15 MG PO CAPS: 30.0000 mg | ORAL_CAPSULE | ORAL | 1 refills | Status: AC

## 2023-03-31 NOTE — Assessment & Plan Note (Addendum)
Blood pressure remains stable .  Encouraged to continue with dietary changes and exercise. Tolerating phentermine well, continue phentermine 30 mg daily and can decrease to phentermine 15 mg daily on days that she feels too "jittery".  Patient verbalized understanding and is agreeable to this plan.  Visit #2: Starting Weight: 278 lbs  Current weight: 248 lbs Previous weight: 275 lbs  Change in weight: -27 lbs from last, - 30 lbs total Goal weight: 225 lbs Dietary goals: limit bread and pasta; eat lots of vegetables and protein Exercise goals: walking 5 days weekly Medication: phentermine 30 mg daily Follow-up and referrals: 2 month follow up

## 2023-03-31 NOTE — Progress Notes (Signed)
Virtual Visit via Video Note  I connected with Makayla Matthews on 03/31/23 at 10:30 AM EDT by a video enabled telemedicine application and verified that I am speaking with the correct person using two identifiers.  Patient Location: Home Provider Location: Office/clinic  I discussed the limitations, risks, security, and privacy concerns of performing an evaluation and management service by video and the availability of in person appointments. I also discussed with the patient that there may be a patient responsible charge related to this service. The patient expressed understanding and agreed to proceed.    Subjective   Patient ID: Makayla Matthews, female    DOB: Aug 25, 1965  Age: 57 y.o. MRN: [id number]  Chief Complaint  Patient presents with   Weight Check    HPI Makayla Matthews is a 57 y.o. female presenting today for follow up of weight management. Weight management: Weight has decreased 27 lbs since last visit. They have been following a low-carb, high-protein diet prioritizing vegetables and lean meats while avoiding bread and pasta.  Her family stopped eating out as much and she has seen that it has made a huge difference for her.  Exercise routine includes walking her dog several times each week.  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  She is currently taking phentermine 30 mg daily, denies palpitation, headache, insomnia. Goal weight is 225 lbs.   ROS Negative unless otherwise noted in HPI  Outpatient Medications Prior to Visit  Medication Sig   azelastine (ASTELIN) 0.1 % nasal spray 1 spray each nostril twice daily after sinus rinses   FLUoxetine (PROZAC) 40 MG capsule Take 1 capsule (40 mg total) by mouth daily.   levocetirizine (XYZAL) 5 MG tablet Take 1 tablet (5 mg total) by mouth every evening.   meloxicam (MOBIC) 15 MG tablet Take 1 tablet (15 mg total) by mouth daily.   Multiple Vitamin  (MULTIVITAMIN WITH MINERALS) TABS tablet Take 1 tablet by mouth daily.   phentermine 15 MG capsule TAKE 2 CAPSULES BY MOUTH IN THE MORNING   traZODone (DESYREL) 100 MG tablet TAKE 1 TABLET BY MOUTH AT BEDTIME AS NEEDED FOR SLEEP   No facility-administered medications prior to visit.     Objective:    BP 124/76 Comment: home BP  Ht 5' 6.5" (1.689 m)   Wt 248 lb (112.5 kg)   BMI 39.43 kg/m   Physical Exam General: Speaking clearly in complete sentences without any shortness of breath.  Alert and oriented x3.  Normal judgment. No apparent acute distress.   Assessment & Plan:  Class 2 obesity due to excess calories without serious comorbidity with body mass index (BMI) of 38.0 to 38.9 in adult Assessment & Plan: Blood pressure remains stable .  Encouraged to continue with dietary changes and exercise. Tolerating phentermine well, continue phentermine 30 mg daily and can decrease to phentermine 15 mg daily on days that she feels too "jittery".  Patient verbalized understanding and is agreeable to this plan.  Visit #2: Starting Weight: 278 lbs  Current weight: 248 lbs Previous weight: 275 lbs  Change in weight: -27 lbs from last, - 30 lbs total Goal weight: 225 lbs Dietary goals: limit bread and pasta; eat lots of vegetables and protein Exercise goals: walking 5 days weekly Medication: phentermine 30 mg daily Follow-up and referrals: 2 month follow up      Return in about 2 months (  around 05/31/2023) for follow-up for weight management, fasting blood work 1 week before.   I discussed the assessment and treatment plan with the patient. The patient was provided an opportunity to ask questions, and all were answered. The patient agreed with the plan and demonstrated an understanding of the instructions.   The patient was advised to call back or seek an in-person evaluation if the symptoms worsen or if the condition fails to improve as anticipated.  The above assessment and  management plan was discussed with the patient. The patient verbalized understanding of and has agreed to the management plan.   Melida Quitter, PA

## 2023-04-30 ENCOUNTER — Other Ambulatory Visit: Payer: Self-pay | Admitting: Nurse Practitioner

## 2023-04-30 DIAGNOSIS — F5104 Psychophysiologic insomnia: Secondary | ICD-10-CM

## 2023-05-01 ENCOUNTER — Other Ambulatory Visit: Payer: Self-pay | Admitting: Family Medicine

## 2023-05-01 DIAGNOSIS — F5104 Psychophysiologic insomnia: Secondary | ICD-10-CM

## 2023-05-01 MED ORDER — TRAZODONE HCL 100 MG PO TABS
100.0000 mg | ORAL_TABLET | Freq: Every evening | ORAL | 0 refills | Status: DC | PRN
Start: 2023-05-01 — End: 2023-05-27

## 2023-05-27 ENCOUNTER — Other Ambulatory Visit: Payer: Self-pay | Admitting: Family Medicine

## 2023-05-27 DIAGNOSIS — F5104 Psychophysiologic insomnia: Secondary | ICD-10-CM

## 2023-05-29 MED ORDER — TRAZODONE HCL 100 MG PO TABS: 100.0000 mg | ORAL_TABLET | Freq: Every evening | ORAL | 0 refills | Status: DC | PRN

## 2023-06-30 ENCOUNTER — Other Ambulatory Visit: Payer: Self-pay | Admitting: Family Medicine

## 2023-06-30 DIAGNOSIS — F5104 Psychophysiologic insomnia: Secondary | ICD-10-CM

## 2023-08-10 ENCOUNTER — Encounter: Payer: Self-pay | Admitting: Family Medicine

## 2023-08-24 ENCOUNTER — Other Ambulatory Visit: Payer: Self-pay | Admitting: Family Medicine

## 2023-08-24 DIAGNOSIS — J3089 Other allergic rhinitis: Secondary | ICD-10-CM

## 2023-08-24 DIAGNOSIS — F4323 Adjustment disorder with mixed anxiety and depressed mood: Secondary | ICD-10-CM

## 2023-09-01 ENCOUNTER — Telehealth: Payer: Self-pay | Admitting: Family Medicine

## 2023-09-01 NOTE — Telephone Encounter (Signed)
 Patient husband Makayla Matthews sees Dr. Para March and wants to know if she can establish care with him as well. Please advise. Thank you!

## 2023-09-03 NOTE — Telephone Encounter (Signed)
Yes , please set up. Thanks

## 2023-09-04 NOTE — Telephone Encounter (Signed)
 Closing encounter this is already being addressed

## 2023-10-02 ENCOUNTER — Other Ambulatory Visit: Payer: Self-pay | Admitting: Family Medicine

## 2023-10-02 DIAGNOSIS — F5104 Psychophysiologic insomnia: Secondary | ICD-10-CM

## 2023-11-29 ENCOUNTER — Other Ambulatory Visit: Payer: Self-pay | Admitting: Family Medicine

## 2023-11-29 DIAGNOSIS — J3089 Other allergic rhinitis: Secondary | ICD-10-CM

## 2023-11-29 DIAGNOSIS — F4323 Adjustment disorder with mixed anxiety and depressed mood: Secondary | ICD-10-CM

## 2023-11-29 NOTE — Telephone Encounter (Signed)
 LVM for patient to contact office to set up an appointment and then we can send medication in to last until that appointment.  She was supposed to follow up in November.  It also looks like she may be establishing care somewhere else so we need to confirm this as well.

## 2023-12-09 ENCOUNTER — Other Ambulatory Visit: Payer: Self-pay | Admitting: Family Medicine

## 2023-12-09 DIAGNOSIS — F5104 Psychophysiologic insomnia: Secondary | ICD-10-CM

## 2023-12-11 ENCOUNTER — Other Ambulatory Visit: Payer: Self-pay

## 2023-12-11 NOTE — Telephone Encounter (Signed)
 Rx for trazodone  sent to pharmacy. Refill appropriate as it has been >30 days since last fill.

## 2024-01-02 ENCOUNTER — Ambulatory Visit: Payer: Self-pay

## 2024-01-02 NOTE — Progress Notes (Deleted)
   Established Patient Office Visit  Subjective   Patient ID: Makayla Matthews, female    DOB: [birth year]  Age: 58 y.o. MRN: [id number]  No chief complaint on file.   HPI  Makayla Matthews is a 58 y.o. y/o female who presents to the clinic today for follow up on weight management. Patient has been taking phentermine  since February 2024.  {History (Optional):23778}  ROS Per HPI.    Objective:     There were no vitals taken for this visit. {Vitals History (Optional):23777}  Physical Exam   No results found for any visits on 01/02/24.  {Labs (Optional):23779}  The 10-year ASCVD risk score (Arnett DK, et al., 2019) is: 3.4%    Assessment & Plan:   There are no diagnoses linked to this encounter.  No follow-ups on file.    Saddie JULIANNA Sacks, PA-C

## 2024-01-10 ENCOUNTER — Ambulatory Visit: Payer: Self-pay

## 2024-01-10 NOTE — Telephone Encounter (Signed)
 FYI Only or Action Required?: FYI only for provider.  Called Nurse Triage reporting Insect Bite.  Symptoms began several days ago.  Interventions attempted: Nothing.  Symptoms are: gradually worsening.  Triage Disposition: See HCP Within 4 Hours (Or PCP Triage)  Patient/caregiver understands and will follow disposition?: Yes, will follow disposition  Pt c/o getting bit or stung to calf, husband is the caller but states that she is unaware if/what she was bit by. Started a couple days ago. Denies DM. + redness and swelling. 8/10 pain. Moderate itching. Had fever when started, but does not now. Denies SOB.   Copied from CRM [id number]. Topic: Clinical - Red Word Triage >> Jan 10, 2024  2:05 PM Robinson H wrote: [facility] that prompted transfer to Nurse Triage: Bit or stung by something on right leg is swollen and painful Reason for Disposition  [1] SEVERE bite pain AND [2] not improved after 2 hours of pain medicine  Protocols used: Insect Bite-A-AH

## 2024-02-21 ENCOUNTER — Other Ambulatory Visit: Payer: Self-pay

## 2024-02-21 DIAGNOSIS — F5104 Psychophysiologic insomnia: Secondary | ICD-10-CM

## 2024-04-08 ENCOUNTER — Other Ambulatory Visit: Payer: Self-pay

## 2024-04-08 DIAGNOSIS — F5104 Psychophysiologic insomnia: Secondary | ICD-10-CM
